# Patient Record
Sex: Female | Born: 1935 | Race: White | Hispanic: Yes | Smoking: Never smoker
Health system: Southern US, Community
[De-identification: ages and names within clinical notes are randomized; demographics above are authoritative.]

## PROBLEM LIST (undated history)

## (undated) DIAGNOSIS — Q211 Atrial septal defect: Secondary | ICD-10-CM

## (undated) DIAGNOSIS — M1710 Unilateral primary osteoarthritis, unspecified knee: Secondary | ICD-10-CM

## (undated) DIAGNOSIS — M545 Low back pain, unspecified: Secondary | ICD-10-CM

## (undated) DIAGNOSIS — E119 Type 2 diabetes mellitus without complications: Secondary | ICD-10-CM

## (undated) DIAGNOSIS — M179 Osteoarthritis of knee, unspecified: Secondary | ICD-10-CM

## (undated) DIAGNOSIS — E7800 Pure hypercholesterolemia, unspecified: Secondary | ICD-10-CM

## (undated) DIAGNOSIS — I253 Aneurysm of heart: Secondary | ICD-10-CM

## (undated) DIAGNOSIS — Q2112 Patent foramen ovale: Secondary | ICD-10-CM

## (undated) HISTORY — PX: HIATAL HERNIA REPAIR: SHX195

---

## 2010-01-25 ENCOUNTER — Emergency Department (HOSPITAL_COMMUNITY): Admission: EM | Admit: 2010-01-25 | Discharge: 2010-01-25 | Payer: Self-pay | Admitting: Emergency Medicine

## 2014-09-22 ENCOUNTER — Inpatient Hospital Stay (HOSPITAL_COMMUNITY): Payer: Medicare Other

## 2014-09-22 ENCOUNTER — Encounter (HOSPITAL_COMMUNITY): Payer: Self-pay | Admitting: Emergency Medicine

## 2014-09-22 ENCOUNTER — Inpatient Hospital Stay (HOSPITAL_COMMUNITY)
Admission: EM | Admit: 2014-09-22 | Discharge: 2014-09-27 | DRG: 062 | Disposition: A | Discharge status: Rehabilitation Facility | Payer: Medicare Other | Attending: Neurology | Admitting: Neurology

## 2014-09-22 ENCOUNTER — Emergency Department (HOSPITAL_COMMUNITY): Payer: Medicare Other

## 2014-09-22 DIAGNOSIS — M1612 Unilateral primary osteoarthritis, left hip: Secondary | ICD-10-CM | POA: Diagnosis present

## 2014-09-22 DIAGNOSIS — K219 Gastro-esophageal reflux disease without esophagitis: Secondary | ICD-10-CM | POA: Diagnosis present

## 2014-09-22 DIAGNOSIS — R52 Pain, unspecified: Secondary | ICD-10-CM

## 2014-09-22 DIAGNOSIS — G8191 Hemiplegia, unspecified affecting right dominant side: Secondary | ICD-10-CM | POA: Diagnosis present

## 2014-09-22 DIAGNOSIS — R4701 Aphasia: Secondary | ICD-10-CM | POA: Diagnosis present

## 2014-09-22 DIAGNOSIS — E785 Hyperlipidemia, unspecified: Secondary | ICD-10-CM | POA: Diagnosis present

## 2014-09-22 DIAGNOSIS — I639 Cerebral infarction, unspecified: Secondary | ICD-10-CM

## 2014-09-22 DIAGNOSIS — E119 Type 2 diabetes mellitus without complications: Secondary | ICD-10-CM

## 2014-09-22 DIAGNOSIS — R4781 Slurred speech: Secondary | ICD-10-CM | POA: Diagnosis present

## 2014-09-22 DIAGNOSIS — Z7982 Long term (current) use of aspirin: Secondary | ICD-10-CM

## 2014-09-22 DIAGNOSIS — E780 Pure hypercholesterolemia: Secondary | ICD-10-CM | POA: Diagnosis present

## 2014-09-22 DIAGNOSIS — Z23 Encounter for immunization: Secondary | ICD-10-CM | POA: Diagnosis not present

## 2014-09-22 DIAGNOSIS — Q211 Atrial septal defect: Secondary | ICD-10-CM

## 2014-09-22 DIAGNOSIS — R471 Dysarthria and anarthria: Secondary | ICD-10-CM | POA: Diagnosis present

## 2014-09-22 DIAGNOSIS — R29810 Facial weakness: Secondary | ICD-10-CM | POA: Diagnosis present

## 2014-09-22 DIAGNOSIS — I517 Cardiomegaly: Secondary | ICD-10-CM

## 2014-09-22 DIAGNOSIS — I6340 Cerebral infarction due to embolism of unspecified cerebral artery: Principal | ICD-10-CM | POA: Diagnosis present

## 2014-09-22 DIAGNOSIS — M25552 Pain in left hip: Secondary | ICD-10-CM

## 2014-09-22 HISTORY — DX: Type 2 diabetes mellitus without complications: E11.9

## 2014-09-22 HISTORY — DX: Pure hypercholesterolemia, unspecified: E78.00

## 2014-09-22 LAB — URINE MICROSCOPIC-ADD ON

## 2014-09-22 LAB — LIPID PANEL
Cholesterol: 156 mg/dL (ref 0–200)
HDL: 48 mg/dL (ref >39)
LDL Cholesterol: 92 mg/dL (ref 0–99)
Total CHOL/HDL Ratio: 3.3 RATIO
Triglycerides: 80 mg/dL (ref <150)
VLDL: 16 mg/dL (ref 0–40)

## 2014-09-22 LAB — COMPREHENSIVE METABOLIC PANEL
ALK PHOS: 56 U/L (ref 39–117)
ALT: 15 U/L (ref 0–35)
AST: 23 U/L (ref 0–37)
Albumin: 3.7 g/dL (ref 3.5–5.2)
Anion gap: 11 (ref 5–15)
BUN: 18 mg/dL (ref 6–23)
CHLORIDE: 101 meq/L (ref 96–112)
CO2: 26 mEq/L (ref 19–32)
Calcium: 9.3 mg/dL (ref 8.4–10.5)
Creatinine, Ser: 0.74 mg/dL (ref 0.50–1.10)
GFR, EST NON AFRICAN AMERICAN: 79 mL/min — AB (ref >90)
GLUCOSE: 148 mg/dL — AB (ref 70–99)
POTASSIUM: 4 meq/L (ref 3.7–5.3)
Sodium: 138 mEq/L (ref 137–147)
TOTAL PROTEIN: 7 g/dL (ref 6.0–8.3)
Total Bilirubin: 0.4 mg/dL (ref 0.3–1.2)

## 2014-09-22 LAB — CBC
HCT: 39.4 % (ref 36.0–46.0)
Hemoglobin: 13 g/dL (ref 12.0–15.0)
MCH: 29 pg (ref 26.0–34.0)
MCHC: 33 g/dL (ref 30.0–36.0)
MCV: 87.9 fL (ref 78.0–100.0)
Platelets: 149 10*3/uL — ABNORMAL LOW (ref 150–400)
RBC: 4.48 MIL/uL (ref 3.87–5.11)
RDW: 13 % (ref 11.5–15.5)
WBC: 5.3 10*3/uL (ref 4.0–10.5)

## 2014-09-22 LAB — URINALYSIS, ROUTINE W REFLEX MICROSCOPIC
BILIRUBIN URINE: NEGATIVE
Glucose, UA: 250 mg/dL — AB
KETONES UR: NEGATIVE mg/dL
Leukocytes, UA: NEGATIVE
NITRITE: NEGATIVE
PH: 5.5 (ref 5.0–8.0)
Protein, ur: NEGATIVE mg/dL
Specific Gravity, Urine: 1.011 (ref 1.005–1.030)
UROBILINOGEN UA: 0.2 mg/dL (ref 0.0–1.0)

## 2014-09-22 LAB — GLUCOSE, CAPILLARY
GLUCOSE-CAPILLARY: 142 mg/dL — AB (ref 70–99)
GLUCOSE-CAPILLARY: 114 mg/dL — AB (ref 70–99)
Glucose-Capillary: 139 mg/dL — ABNORMAL HIGH (ref 70–99)
Glucose-Capillary: 125 mg/dL — ABNORMAL HIGH (ref 70–99)

## 2014-09-22 LAB — DIFFERENTIAL
BASOS ABS: 0 10*3/uL (ref 0.0–0.1)
BASOS PCT: 0 % (ref 0–1)
Eosinophils Absolute: 0.1 10*3/uL (ref 0.0–0.7)
Eosinophils Relative: 3 % (ref 0–5)
LYMPHS ABS: 2 10*3/uL (ref 0.7–4.0)
Lymphocytes Relative: 37 % (ref 12–46)
MONOS PCT: 10 % (ref 3–12)
Monocytes Absolute: 0.5 10*3/uL (ref 0.1–1.0)
NEUTROS ABS: 2.7 10*3/uL (ref 1.7–7.7)
NEUTROS PCT: 50 % (ref 43–77)

## 2014-09-22 LAB — I-STAT CHEM 8, ED
BUN: 18 mg/dL (ref 6–23)
CHLORIDE: 103 meq/L (ref 96–112)
Calcium, Ion: 1.17 mmol/L (ref 1.13–1.30)
Creatinine, Ser: 0.8 mg/dL (ref 0.50–1.10)
GLUCOSE: 150 mg/dL — AB (ref 70–99)
HEMATOCRIT: 42 % (ref 36.0–46.0)
HEMOGLOBIN: 14.3 g/dL (ref 12.0–15.0)
POTASSIUM: 3.8 meq/L (ref 3.7–5.3)
SODIUM: 139 meq/L (ref 137–147)
TCO2: 26 mmol/L (ref 0–100)

## 2014-09-22 LAB — PROTIME-INR
INR: 1 (ref 0.00–1.49)
PROTHROMBIN TIME: 13.2 s (ref 11.6–15.2)

## 2014-09-22 LAB — I-STAT TROPONIN, ED: TROPONIN I, POC: 0 ng/mL (ref 0.00–0.08)

## 2014-09-22 LAB — RAPID URINE DRUG SCREEN, HOSP PERFORMED
AMPHETAMINES: NOT DETECTED
BARBITURATES: NOT DETECTED
BENZODIAZEPINES: NOT DETECTED
COCAINE: NOT DETECTED
OPIATES: NOT DETECTED
TETRAHYDROCANNABINOL: NOT DETECTED

## 2014-09-22 LAB — HEMOGLOBIN A1C
Hgb A1c MFr Bld: 6 % — ABNORMAL HIGH (ref <5.7)
MEAN PLASMA GLUCOSE: 126 mg/dL — AB (ref <117)

## 2014-09-22 LAB — MRSA PCR SCREENING: MRSA by PCR: NEGATIVE

## 2014-09-22 LAB — ETHANOL

## 2014-09-22 LAB — APTT: APTT: 26 s (ref 24–37)

## 2014-09-22 MED ORDER — LINAGLIPTIN 5 MG PO TABS
5.0000 mg | ORAL_TABLET | Freq: Every day | ORAL | Status: DC
  Administered 2014-09-22 – 2014-09-27 (×6): 5 mg via ORAL
  Filled 2014-09-22 (×6): qty 1

## 2014-09-22 MED ORDER — ACETAMINOPHEN 650 MG RE SUPP: 650.0000 mg | RECTAL | Status: DC | PRN

## 2014-09-22 MED ORDER — STROKE: EARLY STAGES OF RECOVERY BOOK
Freq: Once | Status: AC
  Administered 2014-09-22: 06:00:00
  Filled 2014-09-22: qty 1

## 2014-09-22 MED ORDER — SODIUM CHLORIDE 0.9 % IV SOLN
250.0000 mL | Freq: Once | INTRAVENOUS | Status: DC
Start: 2014-09-22 — End: 2014-09-22

## 2014-09-22 MED ORDER — INSULIN ASPART 100 UNIT/ML ~~LOC~~ SOLN
0.0000 [IU] | SUBCUTANEOUS | Status: DC
  Administered 2014-09-22 – 2014-09-23 (×2): 2 [IU] via SUBCUTANEOUS

## 2014-09-22 MED ORDER — SIMVASTATIN 40 MG PO TABS
40.0000 mg | ORAL_TABLET | Freq: Every day | ORAL | Status: DC
  Administered 2014-09-22 – 2014-09-27 (×6): 40 mg via ORAL
  Filled 2014-09-22 (×7): qty 1

## 2014-09-22 MED ORDER — OMEGA-3-ACID ETHYL ESTERS 1 G PO CAPS
1.0000 g | ORAL_CAPSULE | Freq: Every day | ORAL | Status: DC
  Administered 2014-09-22 – 2014-09-27 (×6): 1 g via ORAL
  Filled 2014-09-22 (×6): qty 1

## 2014-09-22 MED ORDER — ALTEPLASE (STROKE) FULL DOSE INFUSION
0.9000 mg/kg | Freq: Once | INTRAVENOUS | Status: AC
  Administered 2014-09-22: 74 mg via INTRAVENOUS
  Filled 2014-09-22: qty 74

## 2014-09-22 MED ORDER — LABETALOL HCL 5 MG/ML IV SOLN
10.0000 mg | INTRAVENOUS | Status: DC | PRN
Start: 2014-09-22 — End: 2014-09-27

## 2014-09-22 MED ORDER — SODIUM CHLORIDE 0.9 % IV SOLN
INTRAVENOUS | Status: DC
  Administered 2014-09-22: 06:00:00 via INTRAVENOUS

## 2014-09-22 MED ORDER — PANTOPRAZOLE SODIUM 40 MG IV SOLR
40.0000 mg | Freq: Every day | INTRAVENOUS | Status: DC
  Administered 2014-09-22 – 2014-09-24 (×3): 40 mg via INTRAVENOUS
  Filled 2014-09-22 (×4): qty 40

## 2014-09-22 MED ORDER — ONDANSETRON HCL 4 MG/2ML IJ SOLN
4.0000 mg | Freq: Four times a day (QID) | INTRAMUSCULAR | Status: DC | PRN
  Administered 2014-09-22 – 2014-09-23 (×2): 4 mg via INTRAVENOUS
  Filled 2014-09-22: qty 2

## 2014-09-22 MED ORDER — ACETAMINOPHEN 325 MG PO TABS
650.0000 mg | ORAL_TABLET | ORAL | Status: DC | PRN
  Administered 2014-09-25: 650 mg via ORAL
  Filled 2014-09-22 (×2): qty 2

## 2014-09-22 NOTE — ED Notes (Signed)
Per ems: pt from home, went to bed around midnight this morning and woke up around 0200 this morning with complaints of right arm pain and tongue heaviness, and blurry vision. Pt spanish speaking, daughter at bedside.

## 2014-09-22 NOTE — H&P (Signed)
Admission H&P    Chief Complaint: New onset slurred speech and right-sided weakness.  HPI: Jean Ward is an 80 y.o. female with a history of diabetes mellitus and hyperlipidemia presenting with acute slurred speech and right facial droop as well as weakness of right arm and right leg. Patient's been taking aspirin daily. There is no history of stroke or TIA. CT scan of her head showed no acute intracranial abnormality. She was last seen well at midnight. Patient woke up at 2 AM with presenting deficits. NIH stroke score was 13. Patient was deemed a candidate for TPA administration which was started.  LSN: 12:00 AM on 09/22/2014 tPA Given: Yes mRankin:  Past Medical History  Diagnosis Date  . Diabetes mellitus without complication   . Hypercholesteremia     Past Surgical History  Procedure Laterality Date  . Hernia repair      No family history on file. Social History:  reports that she has never smoked. She does not have any smokeless tobacco history on file. Her alcohol and drug histories are not on file.  Allergies: Not on File   (Not in a hospital admission)  ROS: History obtained from patient's daughter  General ROS: negative for - chills, fatigue, fever, night sweats, weight gain or weight loss Psychological ROS: negative for - behavioral disorder, hallucinations, memory difficulties, mood swings or suicidal ideation Ophthalmic ROS: negative for - blurry vision, double vision, eye pain or loss of vision ENT ROS: negative for - epistaxis, nasal discharge, oral lesions, sore throat, tinnitus or vertigo Allergy and Immunology ROS: negative for - hives or itchy/watery eyes Hematological and Lymphatic ROS: negative for - bleeding problems, bruising or swollen lymph nodes Endocrine ROS: negative for - galactorrhea, hair pattern changes, polydipsia/polyuria or temperature intolerance Respiratory ROS: negative for - cough, hemoptysis, shortness of breath or  wheezing Cardiovascular ROS: negative for - chest pain, dyspnea on exertion, edema or irregular heartbeat Gastrointestinal ROS: negative for - abdominal pain, diarrhea, hematemesis, nausea/vomiting or stool incontinence Genito-Urinary ROS: negative for - dysuria, hematuria, incontinence or urinary frequency/urgency Musculoskeletal ROS: negative for - joint swelling or muscular weakness Neurological ROS: as noted in HPI Dermatological ROS: negative for rash and skin lesion changes  Physical Examination: Blood pressure 167/76, pulse 72, temperature 97.7 F (36.5 C), temperature source Oral, resp. rate 18, height 5' (1.524 m), weight 82.6 kg (182 lb 1.6 oz), SpO2 100.00%.  HEENT-  Normocephalic, no lesions, without obvious abnormality.  Normal external eye and conjunctiva.  Normal TM's bilaterally.  Normal auditory canals and external ears. Normal external nose, mucus membranes and septum.  Normal pharynx. Neck supple with no masses, nodes, nodules or enlargement. Cardiovascular - regular rate and rhythm, S1, S2 normal, no murmur, click, rub or gallop Lungs - chest clear, no wheezing, rales, normal symmetric air entry, Heart exam - S1, S2 normal, no murmur, no gallop, rate regular Abdomen - soft, non-tender; bowel sounds normal; no masses,  no organomegaly Extremities - no joint deformities, effusion, or inflammation and no edema  Neurologic Examination: Mental Status: Lethargic but verbally responsive, oriented.  Speech is markedly slurred without evidence of aphasia. Able to follow commands with use of left extremities. Cranial Nerves: II-Visual fields were normal. III/IV/VI-Pupils were equal and reacted. Extraocular movements were full and conjugate.    V/VII-no facial numbness; mild right lower facial weakness. VIII-normal. X-moderate to severe dysarthria. Motor: Flaccid paralysis of right upper and lower extremities; normal strength and muscle tone of left extremities. Sensory: Normal  throughout. Deep  Tendon Reflexes: 1+ and symmetric. Plantars: Extensor on the right and flexor on the left Cerebellar: Unable to adequately assess.  Results for orders placed during the hospital encounter of 09/22/14 (from the past 48 hour(s))  I-STAT TROPOININ, ED     Status: None   Collection Time    09/22/14  4:06 AM      Result Value Ref Range   Troponin i, poc 0.00  0.00 - 0.08 ng/mL   Comment 3            Comment: Due to the release kinetics of cTnI,     a negative result within the first hours     of the onset of symptoms does not rule out     myocardial infarction with certainty.     If myocardial infarction is still suspected,     repeat the test at appropriate intervals.  I-STAT CHEM 8, ED     Status: Abnormal   Collection Time    09/22/14  4:08 AM      Result Value Ref Range   Sodium 139  137 - 147 mEq/L   Potassium 3.8  3.7 - 5.3 mEq/L   Chloride 103  96 - 112 mEq/L   BUN 18  6 - 23 mg/dL   Creatinine, Ser 2.13  0.50 - 1.10 mg/dL   Glucose, Bld 086 (*) 70 - 99 mg/dL   Calcium, Ion 5.78  4.69 - 1.30 mmol/L   TCO2 26  0 - 100 mmol/L   Hemoglobin 14.3  12.0 - 15.0 g/dL   HCT 62.9  52.8 - 41.3 %   Ct Head Wo Contrast  09/22/2014   CLINICAL DATA:  Code stroke. Slurred speech and right-sided weakness beginning at 2 a.m.  EXAM: CT HEAD WITHOUT CONTRAST  TECHNIQUE: Contiguous axial images were obtained from the base of the skull through the vertex without intravenous contrast.  COMPARISON:  None.  FINDINGS: Diffuse cerebral atrophy. No significant ventricular dilatation. Mild low-attenuation changes in the deep white matter suggesting small vessel ischemia. No mass effect or midline shift. No abnormal extra-axial fluid collections. Gray-white matter junctions are distinct. Basal cisterns are not effaced. No evidence of acute intracranial hemorrhage. No depressed skull fractures. Visualized paranasal sinuses and mastoid air cells are not opacified.  IMPRESSION: No acute  intracranial abnormalities. Mild atrophy and small vessel ischemic changes.  These results were called by telephone at the time of interpretation on 09/22/2014 at 3:52 am to Dr. Rochele Raring , who verbally acknowledged these results.   Electronically Signed   By: Burman Nieves M.D.   On: 09/22/2014 03:53    Assessment: 80 y.o. female with multiple risk factors for stroke presenting with probable left subcortical MCA territory ischemic infarction.  Stroke Risk Factors - diabetes mellitus and hyperlipidemia  Plan: 1. HgbA1c, fasting lipid panel 2. MRI, MRA  of the brain without contrast 3. PT consult, OT consult, Speech consult 4. Echocardiogram 5. Carotid dopplers 6. Prophylactic therapy-aspirin 325 mg per day for 300 mg suppository daily CT scan of the head 24 hours post TPA administration shows no intracranial hemorrhage 7. Risk factor modification 8. Telemetry monitoring  Patient's admission evaluation and management required complex decision-making, including use of TPA for acute thrombolytic therapy, as well as admission to neuro intensive care unit and family counseling. Total critical care time was 90 minutes.   C.R. Roseanne Reno, MD [facility] [phone] 09/22/2014, 4:26 AM

## 2014-09-22 NOTE — ED Notes (Signed)
Pt reporting she thinks her vision is improving and family reports the patients speech is beginning to improve.

## 2014-09-22 NOTE — Progress Notes (Signed)
STROKE TEAM PROGRESS NOTE   HISTORY Jean Ward is an 80 y.o. female with a history of diabetes mellitus and hyperlipidemia presenting with acute slurred speech and right facial droop as well as weakness of right arm and right leg that started 12:00 AM on 09/22/2014. Patient's been taking aspirin daily. There is no history of stroke or TIA. CT scan of her head showed no acute intracranial abnormality. She was last seen well at midnight. Patient woke up at 2 AM with presenting deficits. NIH stroke score was 13. Patient was deemed a candidate for TPA administration which was started. She was admitted to the neuro ICU for further evaluation and treatment.   SUBJECTIVE (INTERVAL HISTORY) Her grandson and ? Son-in-law are at the bedside.  Overall she feels her condition is rapidly improving. Patient is concerned about MRI - family helped with translation. Patient is spanish speaking. She has shown miraculous improvement after TPA and has recovered completely with practically no residual deficits   OBJECTIVE Temp:  [97.6 F (36.4 C)-97.7 F (36.5 C)] 97.6 F (36.4 C) (10/04 0803) Pulse Rate:  [65-81] 71 (10/04 1000) Cardiac Rhythm:  [-] Normal sinus rhythm (10/04 0800) Resp:  [11-20] 11 (10/04 1000) BP: (85-167)/(31-76) 85/62 mmHg (10/04 1000) SpO2:  [93 %-100 %] 98 % (10/04 1000) Weight:  [82.6 kg (182 lb 1.6 oz)] 82.6 kg (182 lb 1.6 oz) (10/04 0402)   Recent Labs Lab 09/22/14 0759  GLUCAP 139*    Recent Labs Lab 09/22/14 0401 09/22/14 0408  NA 138 139  K 4.0 3.8  CL 101 103  CO2 26  --   GLUCOSE 148* 150*  BUN 18 18  CREATININE 0.74 0.80  CALCIUM 9.3  --     Recent Labs Lab 09/22/14 0401  AST 23  ALT 15  ALKPHOS 56  BILITOT 0.4  PROT 7.0  ALBUMIN 3.7    Recent Labs Lab 09/22/14 0401 09/22/14 0408  WBC 5.3  --   NEUTROABS 2.7  --   HGB 13.0 14.3  HCT 39.4 42.0  MCV 87.9  --   PLT 149*  --    No results found for this basename: CKTOTAL, CKMB, CKMBINDEX,  TROPONINI,  in the last 168 hours  Recent Labs  09/22/14 0401  LABPROT 13.2  INR 1.00    Recent Labs  09/22/14 0418  COLORURINE YELLOW  LABSPEC 1.011  PHURINE 5.5  GLUCOSEU 250*  HGBUR TRACE*  BILIRUBINUR NEGATIVE  KETONESUR NEGATIVE  PROTEINUR NEGATIVE  UROBILINOGEN 0.2  NITRITE NEGATIVE  LEUKOCYTESUR NEGATIVE       Component Value Date/Time   CHOL 156 09/22/2014 0402   TRIG 80 09/22/2014 0402   HDL 48 09/22/2014 0402   CHOLHDL 3.3 09/22/2014 0402   VLDL 16 09/22/2014 0402   LDLCALC 92 09/22/2014 0402   No results found for this basename: HGBA1C      Component Value Date/Time   LABOPIA NONE DETECTED 09/22/2014 0418   COCAINSCRNUR NONE DETECTED 09/22/2014 0418   LABBENZ NONE DETECTED 09/22/2014 0418   AMPHETMU NONE DETECTED 09/22/2014 0418   THCU NONE DETECTED 09/22/2014 0418   LABBARB NONE DETECTED 09/22/2014 0418     Recent Labs Lab 09/22/14 0401  ETH <11    Ct Head Wo Contrast 09/22/2014   No acute intracranial abnormalities. Mild atrophy and small vessel ischemic changes.     PHYSICAL EXAM Pleasant elderly Hispanic lady currently not in distress.Awake alert. Afebrile. Head is nontraumatic. Neck is supple without bruit. Hearing is normal. Cardiac exam  no murmur or gallop. Lungs are clear to auscultation. Distal pulses are well felt. Neurological Exam ; exam limited due to requiring an interpreter Awake  Alert oriented x 3. Normal speech and language.eye movements full without nystagmus.fundi were not visualized. Vision acuity and fields appear normal. Hearing is normal. Palatal movements are normal. Face symmetric. Tongue midline. Normal strength, tone, reflexes and coordination. Normal sensation. Gait deferred. ASSESSMENT/PLAN  [name] is a 80 y.o. female with history of diabetes mellitus and hyperlipidemia presenting with new onset slurred speech and right hemiparesis. She did receive IV t-PA 09/22/2014 at 0413. Initial CT imaging unremarkable.      Stroke:  suspect left brain infarct s/p IV tPA, infarct secondary to unknown source, workup pending. Significant improvement post tPA.  MRI  pending   MRA  pending   Carotid Doppler  pending   2D Echo  pending    aspirin 81 mg orally every day prior to admission, now on no antithrombotics as within 24h of tPA. will likely change to plavix at time of discharge if no embolic source found  SCDs for VTE prophylaxis  NPO .   Bedrest. May be OOB. Ok for evals  Resultant aphasia has resolved, mild right hemiparesis arm remains  Therapy recommendations:  pending   Ongoing aggressive risk factor management  Risk factor education  Keep in ICU 24h post tPA. Anticipate transfer to the floor in am  Disposition:  Pending. Anticipate home with family.  Hyperlipidemia  Home meds:  Lovaza, xocor  LDL 92  Continue statin  Diabetes  HgbA1c pending, Goal < 7.0  Add CBGs, SSI  Resume home meds  Other Stroke Risk Factors Advanced age  Other Active Problems  GERD on home prilosec  Baseline right vision deficit  Hospital day # 0  SHARON BIBY, MSN, RN, ANVP-BC, ANP-BC, [facility] Pager: [phone] 09/22/2014 10:24 AM  This patient is critically ill and at significant risk of neurological worsening, death and care requires constant monitoring of vital signs, hemodynamics,respiratory and cardiac monitoring,review of multiple databases, neurological assessment, discussion with family, other specialists and medical decision making of high complexity.I have made any additions or clarifications directly to the above note.  I spent 30 minutes of neurocritical care time  in the care of  this patient. I have personally examined this patient, reviewed notes, independently viewed imaging studies, participated in medical decision making and plan of care. I have made any additions or clarifications directly to the above note. Agree with note above.   Delia Heady,  MD Medical Director [facility] Pager: [phone] 09/22/2014 2:46 PM    To contact Stroke Continuity provider, please refer to [email or web address]. After hours, contact General Neurology

## 2014-09-22 NOTE — ED Notes (Signed)
NRB removed, 6L [place] placed. O2-98%

## 2014-09-22 NOTE — Progress Notes (Signed)
TPA Delivery   Jean Ward is an 80 y.o. female who [facility] with a chief complaint of  Chief Complaint  Patient presents with  . Code Stroke   TPA delivered to bedside at 0408  Abran DukeLedford, Joniece Smotherman 09/22/2014, 4:15 AM

## 2014-09-22 NOTE — ED Notes (Signed)
Patient arrived to ER at 0336, determined to call code stroke, and transferred to CT.

## 2014-09-22 NOTE — Progress Notes (Signed)
Echo Lab  2D Echocardiogram completed.  Avyan Livesay L Elleen Coulibaly, RDCS 09/22/2014 12:21 PM

## 2014-09-22 NOTE — Progress Notes (Signed)
Code Stoke called on 80 y.o female. Per pt daughter at bedside, pt went to sleep around 12 midnight and woke up around 2 am and called her. On the phone call her mother complained of difficulty speaking, blurred vision and inability to move her right arm and leg. Daughter called EMS and pt was [facility]. Pt has hx of DM and high cholesterol, currently takes aspirin daily. CT scan done stat, negative for a bleed per Dr. Roseanne RenoStewart. CBG 104. NIHSS done yielding 13. Pt flaccid on her right side, right side facial droop,slurred speech, drowsy but responsive. On initial assessment due to closing window NIHSS completed quickly, language barrier present with telephone interpreter service used then Daughter interpreting once at bedside. Unable to assess limb ataxia at that time per Dr. Roseanne RenoStewart. 2 pivs and FC placed. Pressure dressing applied to bruise on left hand resulting from EMS PIV attempt in field. TPA gtt started at 0413.  0440 -Pt able to squeeze right hand gently, and some movement to command in right foot. Dysarthria and vision  improving per family at bedside. Bed obtained on 3 M, report called per ED RN.

## 2014-09-22 NOTE — Progress Notes (Signed)
VASCULAR LAB PRELIMINARY  PRELIMINARY  PRELIMINARY  PRELIMINARY  Carotid duplex completed.    Preliminary report:  Bilateral:  1-39% ICA stenosis.  Vertebral artery flow is antegrade.      Firmin Belisle, RVT 09/22/2014, 11:20 AM

## 2014-09-22 NOTE — ED Provider Notes (Signed)
TIME SEEN: 3:40 AM  CHIEF COMPLAINT: Dysarthria, right-sided facial droop and right-sided weakness  HPI: Patient is a 80 y.o. F Hispanic female with history of diabetes, hyperlipidemia who presents to the emergency department with right-sided weakness, dysarthria when she woke up at 2 AM. Went to bed around midnight. No history of head injury. No headache. No prior history of stroke. On anticoagulation. No chest pain or shortness of breath. Glucose was EMS was 127. Blood pressure normal.  ROS: See HPI Constitutional: no fever  Eyes: no drainage  ENT: no runny nose   Cardiovascular:  no chest pain  Resp: no SOB  GI: no vomiting GU: no dysuria Integumentary: no rash  Allergy: no hives  Musculoskeletal: no leg swelling  Neurological: no slurred speech ROS otherwise negative  PAST MEDICAL HISTORY/PAST SURGICAL HISTORY:  No past medical history on file.  MEDICATIONS:  Prior to Admission medications   Not on File    ALLERGIES:  Allergies not on file  SOCIAL HISTORY:  History  Substance Use Topics  . Smoking status: Not on file  . Smokeless tobacco: Not on file  . Alcohol Use: Not on file    FAMILY HISTORY: No family history on file.  EXAM: BP 131/42  Pulse 68  Temp(Src) 97.6 F (36.4 C) (Oral)  Resp 13  Ht 5' (1.524 m)  Wt 182 lb 1.6 oz (82.6 kg)  BMI 35.56 kg/m2  SpO2 96% CONSTITUTIONAL: Alert and oriented and responds appropriately to questions. Well-appearing; well-nourished HEAD: Normocephalic EYES: Conjunctivae clear, PERRL ENT: normal nose; no rhinorrhea; moist mucous membranes; pharynx without lesions noted NECK: Supple, no meningismus, no LAD  CARD: RRR; S1 and S2 appreciated; no murmurs, no clicks, no rubs, no gallops RESP: Normal chest excursion without splinting or tachypnea; breath sounds clear and equal bilaterally; no wheezes, no rhonchi, no rales,  ABD/GI: Normal bowel sounds; non-distended; soft, non-tender, no rebound, no guarding BACK:  The back  appears normal and is non-tender to palpation, there is no CVA tenderness EXT: Normal ROM in all joints; non-tender to palpation; no edema; normal capillary refill; no cyanosis    SKIN: Normal color for age and race; warm NEURO: Right-sided facial droop, right-sided hemiparesis, dysarthria, moving left upper and lower extremity normally PSYCH: The patient's mood and manner are appropriate. Grooming and personal hygiene are appropriate.  MEDICAL DECISION MAKING: Patient here with a code stroke. Vital signs, glucose normal. Protecting airway. Patient head CT.  ED PROGRESS: Head CT shows no acute abnormality. Neurology at bedside.   Dr. Roseanne RenoStewart at bedside. Recommends giving TPA for stroke.  Will admit.   Labs unremarkable.        CRITICAL CARE Performed by: Raelyn NumberWARD, Amonda Brillhart N   Total critical care time: 40 minutes  Critical care time was exclusive of separately billable procedures and treating other patients.  Critical care was necessary to treat or prevent imminent or life-threatening deterioration.  Critical care was time spent personally by me on the following activities: development of treatment plan with patient and/or surrogate as well as nursing, discussions with consultants, evaluation of patient's response to treatment, examination of patient, obtaining history from patient or surrogate, ordering and performing treatments and interventions, ordering and review of laboratory studies, ordering and review of radiographic studies, pulse oximetry and re-evaluation of patient's condition.   Layla MawKristen N Damoni Causby, DO 09/22/14 [phone]

## 2014-09-22 NOTE — ED Notes (Signed)
Pharmacy contacted about tpa ordered stat by rapid nurse brooke

## 2014-09-22 NOTE — Evaluation (Signed)
Clinical/Bedside Swallow Evaluation Patient Details  Name: Jean Ward MRN: [id number] Date of Birth: 03-Nov-1933  Today's Date: 09/22/2014 Time: [id number] SLP Time Calculation (min): 16 min  Past Medical History:  Past Medical History  Diagnosis Date  . Diabetes mellitus without complication   . Hypercholesteremia    Past Surgical History:  Past Surgical History  Procedure Laterality Date  . Hernia repair     HPI:  80 y.o. female with a history of GERD, diabetes mellitus and hyperlipidemia presenting with acute slurred speech and right facial droop as well as weakness of right arm and right leg. Initial head CT showed no acute bleed. MD suspects left brain infarct. TPA administered. NIH score 13.    Assessment / Plan / Recommendation Clinical Impression  Patient presents with a functional oropharyngeal swallow without overt s/s of aspiration. No SLP f/u indicated at this time. Educated patient and family regarding s/s of aspiration and risks given acute CVA.     Aspiration Risk  Mild    Diet Recommendation Regular;Thin liquid   Liquid Administration via: Cup;Straw Medication Administration: Whole meds with liquid Supervision: Patient able to self feed Compensations: Slow rate;Small sips/bites Postural Changes and/or Swallow Maneuvers: Seated upright 90 degrees    Other  Recommendations Oral Care Recommendations: Oral care BID   Follow Up Recommendations  None (for dysphagia)    Frequency and Duration        Pertinent Vitals/Pain n/a        Swallow Study    General HPI: 80 y.o. female with a history of GERD, diabetes mellitus and hyperlipidemia presenting with acute slurred speech and right facial droop as well as weakness of right arm and right leg. Initial head CT showed no acute bleed. MD suspects left brain infarct. TPA administered. NIH score 13.  Type of Study: Bedside swallow evaluation Previous Swallow Assessment: none noted Diet Prior to this Study:  NPO Temperature Spikes Noted: No Respiratory Status: Room air History of Recent Intubation: No Behavior/Cognition: Alert;Cooperative;Pleasant mood Oral Cavity - Dentition: Dentures, top;Dentures, bottom Self-Feeding Abilities: Able to feed self Patient Positioning: Upright in bed Baseline Vocal Quality: Clear Volitional Cough: Strong Volitional Swallow: Able to elicit    Oral/Motor/Sensory Function Overall Oral Motor/Sensory Function: Appears within functional limits for tasks assessed   Ice Chips Ice chips: Not tested   Thin Liquid Thin Liquid: Within functional limits Presentation: Cup;Self Fed;Straw    Nectar Thick Nectar Thick Liquid: Not tested   Honey Thick Honey Thick Liquid: Not tested   Puree Puree: Within functional limits Presentation: Self Fed;Spoon   Solid   GO   Delesa Kawa MA, CCC-SLP [phone]  Solid: Within functional limits Presentation: Self Fed       Tunya Held Meryl 09/22/2014,11:37 AM

## 2014-09-22 NOTE — Progress Notes (Signed)
Utilization Review Completed.   Vyla Pint, RN, BSN Nurse Case Manager  

## 2014-09-22 NOTE — ED Notes (Signed)
EMS attempted IV to left hand unsuccessfully and mild swelling and bruising to left hand. Pressure dressing applied and secured to left hand prior to starting of TPA.

## 2014-09-23 ENCOUNTER — Encounter (HOSPITAL_COMMUNITY): Payer: Self-pay | Admitting: Radiology

## 2014-09-23 ENCOUNTER — Inpatient Hospital Stay (HOSPITAL_COMMUNITY): Payer: Medicare Other

## 2014-09-23 LAB — GLUCOSE, CAPILLARY
GLUCOSE-CAPILLARY: 136 mg/dL — AB (ref 70–99)
GLUCOSE-CAPILLARY: 113 mg/dL — AB (ref 70–99)
GLUCOSE-CAPILLARY: 112 mg/dL — AB (ref 70–99)
Glucose-Capillary: 114 mg/dL — ABNORMAL HIGH (ref 70–99)
Glucose-Capillary: 122 mg/dL — ABNORMAL HIGH (ref 70–99)
Glucose-Capillary: 129 mg/dL — ABNORMAL HIGH (ref 70–99)
Glucose-Capillary: 94 mg/dL (ref 70–99)

## 2014-09-23 MED ORDER — ASPIRIN EC 325 MG PO TBEC: 325.0000 mg | DELAYED_RELEASE_TABLET | Freq: Every day | ORAL | Status: DC

## 2014-09-23 MED ORDER — CLOPIDOGREL BISULFATE 75 MG PO TABS
75.0000 mg | ORAL_TABLET | Freq: Every day | ORAL | Status: DC
  Administered 2014-09-23 – 2014-09-27 (×5): 75 mg via ORAL
  Filled 2014-09-23 (×5): qty 1

## 2014-09-23 NOTE — Progress Notes (Signed)
Occupational Therapy Evaluation Patient Details Name: Jean Ward MRN: [id number] DOB: 1933/07/06 Today's Date: 09/23/2014    History of Present Illness 80 y.o. female with a history of GERD, diabetes mellitus and hyperlipidemia presenting with acute slurred speech and right facial droop as well as weakness of right arm and right leg. Initial head CT showed no acute bleed. MD suspects left brain infarct. TPA administered. NIH score 13. Neuro workup underway.   Clinical Impression   PTA, pt independent with ADL and mobility. Pt presents with R sided weakness and appears to demonstrate proprioceptive deficits with R U/LE. Feelpt is excellent candidate for CIR and could achieve mod I level to return home with intermittent S of daughter. At this time, pt is min A with mobility and ADL. Pt needs 24/7 S at this time. Family very supportive and interested in CIR. Will follow acutely to maximize functional levl of independence with ADL and mobility to facilitate safe D/C home.     Follow Up Recommendations  CIR;Supervision/Assistance - 24 hour    Equipment Recommendations  3 in 1 bedside comode;Tub/shower bench    Recommendations for Other Services Rehab consult     Precautions / Restrictions Precautions Precautions: Fall Precaution Comments: r inattention      Mobility Bed Mobility Overal bed mobility: Needs Assistance Bed Mobility: Supine to Sit     Supine to sit: Min assist;HOB elevated        Transfers Overall transfer level: Needs assistance Equipment used: 1 person hand held assist Transfers: Sit to/from UGI CorporationStand;Stand Pivot Transfers Sit to Stand: Min assist Stand pivot transfers: Min assist       General transfer comment: Pt states she feels like she is not steady when standing. Feels as if the floor is moving.    Balance Overall balance assessment: Needs assistance Sitting-balance support: Feet supported Sitting balance-Leahy Scale: Fair Sitting balance -  Comments: Pt leaning posteriorly when distracted   Standing balance support: Single extremity supported;During functional activity Standing balance-Leahy Scale: Poor Standing balance comment: r bias at times                            ADL Overall ADL's : Needs assistance/impaired     Grooming: Standing;Min guard   Upper Body Bathing: Set up;Sitting   Lower Body Bathing: Minimal assistance;Sit to/from stand   Upper Body Dressing : Minimal assistance;Sitting   Lower Body Dressing: Minimal assistance;Sit to/from stand   Toilet Transfer: Minimal assistance;Ambulation;Regular Toilet   Toileting- Clothing Manipulation and Hygiene: Minimal assistance;Sit to/from stand       Functional mobility during ADLs: Minimal assistance;Cueing for safety General ADL Comments: appears to demonstate decreased awwareness R side during ADL. Pt ambulating to bathroom and running into wall on R side/leaving R arm outside door frame     Vision                 Additional Comments: Pt reports new blurring of vision. will further assess   Perception     Praxis Praxis Praxis tested?: Within functional limits    Pertinent Vitals/Pain Pain Assessment: No/denies pain     Hand Dominance Right   Extremity/Trunk Assessment Upper Extremity Assessment Upper Extremity Assessment: RUE deficits/detail RUE Deficits / Details: generalized weakness RUE Sensation: decreased proprioception RUE Coordination: decreased fine motor;decreased gross motor Pt reports dropping items when trying to use R hand.    Lower Extremity Assessment Lower Extremity Assessment: Defer to PT evaluation   Cervical /  Trunk Assessment Cervical / Trunk Assessment: Normal   Communication Communication Communication: Prefers language other than English;Other (comment) (spainish)   Cognition Arousal/Alertness: Awake/alert Behavior During Therapy: WFL for tasks assessed/performed Overall Cognitive Status:  Difficult to assess (Pt states her hand/arm and leg do not feel "normal")                     General Comments       Exercises       Shoulder Instructions      Home Living Family/patient expects to be discharged to:: Private residence Living Arrangements: Children Available Help at Discharge: Family;Available PRN/intermittently Type of Home: Apartment Home Access: Stairs to enter Entrance Stairs-Number of Steps: 2   Home Layout: Two level;Other (Comment) (daughter states it canbe arranged for her to live on main le)     Bathroom Shower/Tub: Chief Strategy Officer: Standard Bathroom Accessibility: Yes How Accessible: Accessible via walker Home Equipment: None          Prior Functioning/Environment Level of Independence: Independent             OT Diagnosis: Generalized weakness;Other (comment) (will assess vision)   OT Problem List: Decreased strength;Decreased activity tolerance;Impaired balance (sitting and/or standing);Decreased coordination;Decreased safety awareness;Impaired sensation;Impaired tone;Obesity;Impaired UE functional use   OT Treatment/Interventions: Self-care/ADL training;Therapeutic exercise;Neuromuscular education;DME and/or AE instruction;Therapeutic activities;Cognitive remediation/compensation;Patient/family education;Balance training    OT Goals(Current goals can be found in the care plan section) Acute Rehab OT Goals Patient Stated Goal: to be independent OT Goal Formulation: With patient/family Time For Goal Achievement: 10/07/14 Potential to Achieve Goals: Good  OT Frequency: Min 3X/week   Barriers to D/C: Other (comment) (daughter works during the day)          Co-evaluation              End of Journalist, newspaper During Treatment: Stage manager Communication: Mobility status  Activity Tolerance: Patient tolerated treatment well Patient left: Other (comment) (in w/c ready for transport)    Time: 1610-9604 OT Time Calculation (min): 33 min Charges:  OT General Charges $OT Visit: 1 Procedure OT Evaluation $Initial OT Evaluation Tier I: 1 Procedure OT Treatments $Self Care/Home Management : 23-37 mins G-Codes:    Vicy Medico,HILLARY 2014/10/08, 5:09 PM   [facility], OTR/L  [phone] 10-08-14

## 2014-09-23 NOTE — Consult Note (Signed)
Physical Medicine and Rehabilitation Consult Reason for Consult:strok Referring Physician:  sethi   HPI: Jean Ward is a 80 y.o. Hispanic female with history of DM type 2, hyperlipidemia who was admitted on 09/22/14 with slurred speech, right sided weakness and right facial droop. CT head negative for abnormality and she was treated with tPA with improvements. MRI/MRA brain done revealing widespread areas of restricted diffusion are seen throughout the brain, most notable in the LEFT paramedian pons, consistent with multiple areas of acute infarction question emboli. 2D echo with EF 55-60% and no wall abnormality. Carotid dopplers without significant ICA stenosis. ST evaluation done revealing mild dysarthria and intact cognitive linguistic function.     ROS Past Medical History  Diagnosis Date  . Diabetes mellitus without complication   . Hypercholesteremia    Past Surgical History  Procedure Laterality Date  . Hernia repair     No family history on file. Social History:  reports that she has never smoked. She does not have any smokeless tobacco history on file. Her alcohol and drug histories are not on file. Allergies: No Known Allergies Medications Prior to Admission  Medication Sig Dispense Refill  . aspirin EC 81 MG tablet Take 81 mg by mouth daily.      Marland Kitchen omega-3 acid ethyl esters (LOVAZA) 1 G capsule Take 1 g by mouth daily.      Marland Kitchen omeprazole (PRILOSEC) 20 MG capsule Take 40 mg by mouth daily.      . simvastatin (ZOCOR) 40 MG tablet Take 40 mg by mouth daily.      . sitaGLIPtin (JANUVIA) 100 MG tablet Take 100 mg by mouth daily.        Home: Home Living Family/patient expects to be discharged to:: Private residence Living Arrangements: Children  Functional History:   Functional Status:  Mobility:          ADL:    Cognition: Cognition Overall Cognitive Status: Within Functional Limits for tasks assessed Orientation Level: Oriented  X4 Cognition Overall Cognitive Status: Within Functional Limits for tasks assessed  Blood pressure 123/49, pulse 70, temperature 98 F (36.7 C), temperature source Oral, resp. rate 15, height 5' (1.524 m), weight 82.6 kg (182 lb 1.6 oz), SpO2 94.00%. Physical Exam  Results for orders placed during the hospital encounter of 09/22/14 (from the past 24 hour(s))  GLUCOSE, CAPILLARY     Status: Abnormal   Collection Time    09/22/14 12:06 PM      Result Value Ref Range   Glucose-Capillary 142 (*) 70 - 99 mg/dL  GLUCOSE, CAPILLARY     Status: Abnormal   Collection Time    09/22/14  4:03 PM      Result Value Ref Range   Glucose-Capillary 114 (*) 70 - 99 mg/dL  GLUCOSE, CAPILLARY     Status: Abnormal   Collection Time    09/22/14  8:06 PM      Result Value Ref Range   Glucose-Capillary 125 (*) 70 - 99 mg/dL  GLUCOSE, CAPILLARY     Status: None   Collection Time    09/22/14 11:55 PM      Result Value Ref Range   Glucose-Capillary 94  70 - 99 mg/dL  GLUCOSE, CAPILLARY     Status: Abnormal   Collection Time    09/23/14  4:25 AM      Result Value Ref Range   Glucose-Capillary 113 (*) 70 - 99 mg/dL  GLUCOSE, CAPILLARY     Status:  Abnormal   Collection Time    09/23/14  7:56 AM      Result Value Ref Range   Glucose-Capillary 114 (*) 70 - 99 mg/dL   Comment 1 Notify RN     Comment 2 Documented in Chart     Ct Head Wo Contrast  09/23/2014   CLINICAL DATA:  Followup stroke.  24 hr status post tPA.  EXAM: CT HEAD WITHOUT CONTRAST  TECHNIQUE: Contiguous axial images were obtained from the base of the skull through the vertex without intravenous contrast.  COMPARISON:  Prior MRI from 09/22/2014  FINDINGS: Multiple previously identified small ischemic infarcts involving the left paramedian pons, right occipital lobe, temporal lobe, left frontal and left temporal lobes are not well visualized. No evidence of hemorrhagic transformation status post tPA. No definite new large vessel territory  infarct.  No midline shift. No hydrocephalus. No extra-axial fluid collection.  Calvarium intact.  Scalp soft tissues unremarkable.  No acute abnormality seen about either orbit.  Paranasal sinuses and mastoid air cells are clear.  IMPRESSION: 1. Poor visualization of previously identified small multi focal acute ischemic infarcts involving the brain as seen on prior MRI from 09/22/2014. No evidence of hemorrhagic transformation or other bleed status post tPA. 2. No new acute intracranial process.   Electronically Signed   By: Rise MuBenjamin  McClintock M.D.   On: 09/23/2014 05:45   Ct Head Wo Contrast  09/22/2014   CLINICAL DATA:  Code stroke. Slurred speech and right-sided weakness beginning at 2 a.m.  EXAM: CT HEAD WITHOUT CONTRAST  TECHNIQUE: Contiguous axial images were obtained from the base of the skull through the vertex without intravenous contrast.  COMPARISON:  None.  FINDINGS: Diffuse cerebral atrophy. No significant ventricular dilatation. Mild low-attenuation changes in the deep white matter suggesting small vessel ischemia. No mass effect or midline shift. No abnormal extra-axial fluid collections. Gray-white matter junctions are distinct. Basal cisterns are not effaced. No evidence of acute intracranial hemorrhage. No depressed skull fractures. Visualized paranasal sinuses and mastoid air cells are not opacified.  IMPRESSION: No acute intracranial abnormalities. Mild atrophy and small vessel ischemic changes.  These results were called by telephone at the time of interpretation on 09/22/2014 at 3:52 am to Dr. Rochele RaringKRISTEN WARD , who verbally acknowledged these results.   Electronically Signed   By: Burman NievesWilliam  Stevens M.D.   On: 09/22/2014 03:53   Mr Brain Wo Contrast  09/22/2014   CLINICAL DATA:  RIGHT-sided weakness and dysarthria which began at midnight 09/22/2014. Stroke risk factors include diabetes and hypertension. TPA was administered  EXAM: MRI HEAD WITHOUT CONTRAST  MRA HEAD WITHOUT CONTRAST   TECHNIQUE: Multiplanar, multiecho pulse sequences of the brain and surrounding structures were obtained without intravenous contrast. Angiographic images of the head were obtained using MRA technique without contrast.  COMPARISON:  CT head earlier in the day  FINDINGS: MRI HEAD FINDINGS  Widespread areas of restricted diffusion are seen throughout the brain consistent with multiple areas of acute infarction. These include the LEFT paramedian pons, RIGHT occipital lobe, RIGHT posterior temporal lobe, LEFT posterior frontal cortex, and LEFT posterior temporal lobe. There are no areas of hemorrhage, mass lesion, hydrocephalus, or extra-axial fluid. Mild cerebral and cerebellar atrophy is present. Mild subcortical and periventricular T2 and FLAIR hyperintensities, likely chronic microvascular ischemic change. Normal pituitary and cerebellar tonsils. Mild pannus. Mild cervical spondylosis. Flow voids are maintained. BILATERAL cataract extraction. No sinus or mastoid disease. Compared with prior CT, the infarcts are not visible.  MRA HEAD  FINDINGS  The internal carotid arteries are widely patent. The basilar artery is widely patent with the LEFT vertebral the dominant/sole contributor. RIGHT vertebral contributes to PICA. There is no visible intracranial stenosis or berry aneurysm. No distal MCA and PCA irregularity. 2 mm medial wide necked outpouching from the superior cavernous LEFT ICA consistent with atheromatous disease.  IMPRESSION: Widespread areas of restricted diffusion are seen throughout the brain, most notable in the LEFT paramedian pons, consistent with multiple areas of acute infarction. No areas of hemorrhagic transformation. These lie within different vascular territories. A shower of emboli is not excluded. See discussion above.  No proximal flow reducing lesion is evident on intracranial MRA.   Electronically Signed   By: Davonna Belling M.D.   On: 09/22/2014 16:46   Mr Maxine Glenn Head/brain Wo Cm  09/22/2014    CLINICAL DATA:  RIGHT-sided weakness and dysarthria which began at midnight 09/22/2014. Stroke risk factors include diabetes and hypertension. TPA was administered  EXAM: MRI HEAD WITHOUT CONTRAST  MRA HEAD WITHOUT CONTRAST  TECHNIQUE: Multiplanar, multiecho pulse sequences of the brain and surrounding structures were obtained without intravenous contrast. Angiographic images of the head were obtained using MRA technique without contrast.  COMPARISON:  CT head earlier in the day  FINDINGS: MRI HEAD FINDINGS  Widespread areas of restricted diffusion are seen throughout the brain consistent with multiple areas of acute infarction. These include the LEFT paramedian pons, RIGHT occipital lobe, RIGHT posterior temporal lobe, LEFT posterior frontal cortex, and LEFT posterior temporal lobe. There are no areas of hemorrhage, mass lesion, hydrocephalus, or extra-axial fluid. Mild cerebral and cerebellar atrophy is present. Mild subcortical and periventricular T2 and FLAIR hyperintensities, likely chronic microvascular ischemic change. Normal pituitary and cerebellar tonsils. Mild pannus. Mild cervical spondylosis. Flow voids are maintained. BILATERAL cataract extraction. No sinus or mastoid disease. Compared with prior CT, the infarcts are not visible.  MRA HEAD FINDINGS  The internal carotid arteries are widely patent. The basilar artery is widely patent with the LEFT vertebral the dominant/sole contributor. RIGHT vertebral contributes to PICA. There is no visible intracranial stenosis or berry aneurysm. No distal MCA and PCA irregularity. 2 mm medial wide necked outpouching from the superior cavernous LEFT ICA consistent with atheromatous disease.  IMPRESSION: Widespread areas of restricted diffusion are seen throughout the brain, most notable in the LEFT paramedian pons, consistent with multiple areas of acute infarction. No areas of hemorrhagic transformation. These lie within different vascular territories. A shower of  emboli is not excluded. See discussion above.  No proximal flow reducing lesion is evident on intracranial MRA.   Electronically Signed   By: Davonna Belling M.D.   On: 09/22/2014 16:46    Assessment/Plan: Diagnosis: embolic CVA   RECOMMENDATIONS: This patient's condition is appropriate for continued rehabilitative care in the following setting: [facility] Therapy and Outpatient Therapy Patient has agreed to participate in recommended program. Potentially Note that insurance prior authorization may be required for reimbursement for recommended care.  Comment: Pt is min guard assist hospital day #2. Should continue to improve. Does not require CIR -level rehab.  Ranelle Oyster, MD, [facility] [address] [facility] Physical Medicine & Rehabilitation 09/23/2014     09/23/2014

## 2014-09-23 NOTE — Evaluation (Signed)
Physical Therapy Evaluation Patient Details Name: Jean Ward MRN: [id number] DOB: 06/16/1933 Today's Date: 09/23/2014   History of Present Illness  80 y.o. female with a history of GERD, diabetes mellitus and hyperlipidemia presenting with acute slurred speech and right facial droop as well as weakness of right arm and right leg. Initial head CT showed no acute bleed. MD suspects left brain infarct. TPA administered. NIH score 13. Neuro workup underway.  Clinical Impression  Patient demonstrates deficits in functional mobility as indicated below. Will need continued skilled PT to address deficits and maximize function. Will see as indicated and progress as tolerated. Patient was independent PTA, daughter works during the day; family unsure if able to provide 24/7 assist at this time, therefore patient needs to be independent upon return home, recommending CIR consult at this time, will monitor progress for continued discharge planning.     Follow Up Recommendations CIR    Equipment Recommendations  Other (comment) (TBD)    Recommendations for Other Services Rehab consult     Precautions / Restrictions Precautions Precautions: Fall Restrictions Weight Bearing Restrictions: No      Mobility  Bed Mobility Overal bed mobility: Needs Assistance Bed Mobility: Supine to Sit     Supine to sit: Min assist     General bed mobility comments: increased time to perform, min assist secondary to positioning within bed  Transfers Overall transfer level: Needs assistance Equipment used: Rolling walker (2 wheeled) Transfers: Sit to/from Stand Sit to Stand: Min guard         General transfer comment: VCs for hand placement and safety with use of RW.  Ambulation/Gait Ambulation/Gait assistance: Min guard;Min assist Ambulation Distance (Feet): 190 Feet (80 ft with HHA (min assist for stability)) Assistive device: Rolling walker (2 wheeled) Gait Pattern/deviations: Step-through  pattern;Decreased stride length;Drifts right/left;Trunk flexed Gait velocity: decreased Gait velocity interpretation: <1.8 ft/sec, indicative of risk for recurrent falls General Gait Details: instability noted with ambulation, increased lateral sway to the right  Stairs            Wheelchair Mobility    Modified Rankin (Stroke Patients Only) Modified Rankin (Stroke Patients Only) Pre-Morbid Rankin Score: No symptoms Modified Rankin: Moderately severe disability     Balance Overall balance assessment: Needs assistance   Sitting balance-Leahy Scale: Good     Standing balance support: Bilateral upper extremity supported;During functional activity Standing balance-Leahy Scale: Fair                               Pertinent Vitals/Pain Pain Assessment: No/denies pain    Home Living Family/patient expects to be discharged to:: Private residence Living Arrangements: Children Available Help at Discharge: Family;Available PRN/intermittently Type of Home: Apartment Home Access: Stairs to enter   Entrance Stairs-Number of Steps: 2 Home Layout: Two level Home Equipment: None      Prior Function Level of Independence: Independent               Hand Dominance   Dominant Hand: Right    Extremity/Trunk Assessment   Upper Extremity Assessment: Defer to OT evaluation           Lower Extremity Assessment: RLE deficits/detail;Generalized weakness RLE Deficits / Details: Patient reports functional RLE weakness with noted RLE buckling at times during ambulation       Communication   Communication: Prefers language other than English  Cognition Arousal/Alertness: Awake/alert Behavior During Therapy: WFL for tasks assessed/performed Overall Cognitive Status:  Within Functional Limits for tasks assessed                      General Comments      Exercises        Assessment/Plan    PT Assessment Patient needs continued PT services   PT Diagnosis Difficulty walking;Abnormality of gait;Generalized weakness   PT Problem List Decreased strength;Decreased range of motion;Decreased activity tolerance;Decreased balance;Decreased mobility;Decreased knowledge of use of DME  PT Treatment Interventions DME instruction;Gait training;Stair training;Functional mobility training;Therapeutic activities;Therapeutic exercise;Balance training;Patient/family education   PT Goals (Current goals can be found in the Care Plan section) Acute Rehab PT Goals Patient Stated Goal: to walk normally PT Goal Formulation: With patient/family Time For Goal Achievement: 10/07/14 Potential to Achieve Goals: Good    Frequency Min 4X/week   Barriers to discharge Inaccessible home environment;Decreased caregiver support      Co-evaluation               End of Session Equipment Utilized During Treatment: Gait belt Activity Tolerance: Patient tolerated treatment well Patient left: in chair;with call bell/phone within reach;with family/visitor present Nurse Communication: Mobility status         Time: 0901-0926 PT Time Calculation (min): 25 min   Charges:   PT Evaluation $Initial PT Evaluation Tier I: 1 Procedure PT Treatments $Gait Training: 8-22 mins $Therapeutic Activity: 8-22 mins   PT G CodesFabio Asa 09/23/2014, 11:11 AM Charlotte Crumb, PT DPT  [phone]

## 2014-09-23 NOTE — Evaluation (Signed)
Speech Language Pathology Evaluation Patient Details Name: Jean Ward MRN: [id number] DOB: 1933/11/28 Today's Date: 09/23/2014 Time:  -     Problem List:  Patient Active Problem List   Diagnosis Date Noted  . CVA (cerebral infarction) 09/22/2014  . Diabetes mellitus type 2, controlled, without complications 09/22/2014   Past Medical History:  Past Medical History  Diagnosis Date  . Diabetes mellitus without complication   . Hypercholesteremia    Past Surgical History:  Past Surgical History  Procedure Laterality Date  . Hernia repair     HPI:  80 y.o. female with a history of GERD, diabetes mellitus and hyperlipidemia presenting with acute slurred speech and right facial droop as well as weakness of right arm and right leg. Initial head CT showed no acute bleed. MD suspects left brain infarct. TPA administered. NIH score 13.    Assessment / Plan / Recommendation Clinical Impression  Pt presents with a mild dysarthria only noticable to pt, fully intelligible. Cogntive lingusitic function otherwise in tact. No SLP f/u  needed.     SLP Assessment  Patient does not need any further Speech Lanaguage Pathology Services    Follow Up Recommendations       Frequency and Duration        Pertinent Vitals/Pain     SLP Goals     SLP Evaluation Prior Functioning  Cognitive/Linguistic Baseline: Within functional limits   Cognition  Overall Cognitive Status: Within Functional Limits for tasks assessed Orientation Level: Oriented X4    Comprehension  Auditory Comprehension Overall Auditory Comprehension: Appears within functional limits for tasks assessed    Expression Verbal Expression Overall Verbal Expression: Appears within functional limits for tasks assessed   Oral / Motor Oral Motor/Sensory Function Overall Oral Motor/Sensory Function: Appears within functional limits for tasks assessed Motor Speech Overall Motor Speech: Appears within functional limits for  tasks assessed   GO    Jean DittyBonnie Jacklin Zwick, MA CCC-SLP [phone]  Jean Ward, Jean Ward 09/23/2014, 9:48 AM

## 2014-09-23 NOTE — Progress Notes (Signed)
STROKE TEAM PROGRESS NOTE   HISTORY Jean Ward is an 80 y.o. female with a history of diabetes mellitus and hyperlipidemia presenting with acute slurred speech and right facial droop as well as weakness of right arm and right leg that started 12:00 AM on 09/22/2014. Patient's been taking aspirin daily. There is no history of stroke or TIA. CT scan of her head showed no acute intracranial abnormality. She was last seen well at midnight. Patient woke up at 2 AM with presenting deficits. NIH stroke score was 13. Patient was deemed a candidate for TPA administration which was started. She was admitted to the neuro ICU for further evaluation and treatment.   SUBJECTIVE (INTERVAL HISTORY) She feels is improved. I had an extensive discussion with the patient with help of interpreter and answered questions I also spoke to the patient's daughter   OBJECTIVE Temp:  [97.8 F (36.6 C)-98 F (36.7 C)] 98 F (36.7 C) (10/05 0425) Pulse Rate:  [61-95] 64 (10/05 0755) Cardiac Rhythm:  [-] Normal sinus rhythm (10/05 0800) Resp:  [11-28] 15 (10/05 0755) BP: (85-151)/(41-88) 143/42 mmHg (10/05 0755) SpO2:  [92 %-98 %] 98 % (10/05 0755)   Recent Labs Lab 09/22/14 1603 09/22/14 2006 09/22/14 2355 09/23/14 0425 09/23/14 0756  GLUCAP 114* 125* 94 113* 114*    Recent Labs Lab 09/22/14 0401 09/22/14 0408  NA 138 139  K 4.0 3.8  CL 101 103  CO2 26  --   GLUCOSE 148* 150*  BUN 18 18  CREATININE 0.74 0.80  CALCIUM 9.3  --     Recent Labs Lab 09/22/14 0401  AST 23  ALT 15  ALKPHOS 56  BILITOT 0.4  PROT 7.0  ALBUMIN 3.7    Recent Labs Lab 09/22/14 0401 09/22/14 0408  WBC 5.3  --   NEUTROABS 2.7  --   HGB 13.0 14.3  HCT 39.4 42.0  MCV 87.9  --   PLT 149*  --    No results found for this basename: CKTOTAL, CKMB, CKMBINDEX, TROPONINI,  in the last 168 hours  Recent Labs  09/22/14 0401  LABPROT 13.2  INR 1.00    Recent Labs  09/22/14 0418  COLORURINE YELLOW  LABSPEC  1.011  PHURINE 5.5  GLUCOSEU 250*  HGBUR TRACE*  BILIRUBINUR NEGATIVE  KETONESUR NEGATIVE  PROTEINUR NEGATIVE  UROBILINOGEN 0.2  NITRITE NEGATIVE  LEUKOCYTESUR NEGATIVE       Component Value Date/Time   CHOL 156 09/22/2014 0402   TRIG 80 09/22/2014 0402   HDL 48 09/22/2014 0402   CHOLHDL 3.3 09/22/2014 0402   VLDL 16 09/22/2014 0402   LDLCALC 92 09/22/2014 0402   Lab Results  Component Value Date   HGBA1C 6.0* 09/22/2014      Component Value Date/Time   LABOPIA NONE DETECTED 09/22/2014 0418   COCAINSCRNUR NONE DETECTED 09/22/2014 0418   LABBENZ NONE DETECTED 09/22/2014 0418   AMPHETMU NONE DETECTED 09/22/2014 0418   THCU NONE DETECTED 09/22/2014 0418   LABBARB NONE DETECTED 09/22/2014 0418     Recent Labs Lab 09/22/14 0401  ETH <11    Ct Head Wo Contrast 09/22/2014   No acute intracranial abnormalities. Mild atrophy and small vessel ischemic changes.    Mr Brain Wo Contrast 09/22/2014   Widespread areas of restricted diffusion are seen throughout the brain, most notable in the LEFT paramedian pons, consistent with multiple areas of acute infarction. No areas of hemorrhagic transformation. These lie within different vascular territories. A shower of emboli is not  excluded.   Mr Maxine Glenn Head/brain Wo Cm 09/22/2014   No proximal flow reducing lesion is evident on intracranial MRA.     Carotid Doppler  No evidence of hemodynamically significant internal carotid artery stenosis. Vertebral artery flow is antegrade.   2D Echocardiogram  EF 55-60% with no source of embolus. Mild concentric hypertrophy.   PHYSICAL EXAM Pleasant elderly Hispanic lady currently not in distress.Awake alert. Afebrile. Head is nontraumatic. Neck is supple without bruit. Hearing is normal. Cardiac exam no murmur or gallop. Lungs are clear to auscultation. Distal pulses are well felt. Neurological Exam ; exam limited due to requiring an interpreter Awake  Alert oriented x 3. Normal speech and language.eye  movements full without nystagmus.fundi were not visualized. Vision acuity and fields appear normal. Hearing is normal. Palatal movements are normal. Face symmetric. Tongue midline. Normal strength, tone, reflexes and coordination. Normal sensation. Gait deferred.   ASSESSMENT/PLAN Jean Ward is a 80 y.o. female with history of diabetes mellitus and hyperlipidemia presenting with new onset slurred speech and right hemiparesis. She did receive IV t-PA 09/22/2014 at 0413. Initial CT imaging unremarkable.     Stroke:  suspect left brain infarct s/p IV tPA, infarct secondary to unknown source, workup pending. Significant improvement post tPA. MRI  Widespread areas of restricted diffusion are seen throughout the  brain, most notable in the LEFT paramedian pons, consistent with  multiple areas of acute infarction.     MRA  unremarkable  Carotid Doppler  No significant stenosis  2D Echo  No source of embolus TEE to look for embolic source. Arranged with [facility] for tomorrow.  If positive for PFO (patent foramen ovale), check bilateral lower extremity venous dopplers to rule out DVT as possible source of stroke. (I have made patient NPO after midnight tonight). If TEE negative, a [facility] electrophysiologist will consult and consider placement of an implantable loop recorder to evaluate for atrial fibrillation as etiology of stroke. This has been explained to patient/family by Dr. Pearlean Brownie and they are agreeable. Patient reports she has been having palpitations.  aspirin 81 mg orally every day prior to admission, now on aspirin 325 mg orally every day. Will change to plavix 75 mg daily.  SCDs for VTE prophylaxis  General thin liquids  OOB.   Resultant aphasia has resolved, mild right hemiparesis arm remains  Therapy recommendations:  pending   Ongoing aggressive risk factor management  Risk factor education  transfer to the floor    Disposition:  Pending.   Hyperlipidemia  Home meds:  Lovaza, zocor  LDL 92  Continue statin  Diabetes  HgbA1c 6.0, at Goal < 7.0  Add CBGs, SSI  Other Stroke Risk Factors Advanced age  Other Active Problems  GERD on home prilosec  Baseline right vision deficit  Hospital day # 1  SHARON BIBY, MSN, RN, ANVP-BC, ANP-BC, [facility] Pager: [phone] 09/23/2014 9:13 AM  I have personally examined this patient, reviewed notes, independently viewed imaging studies, participated in medical decision making and plan of care. I have made any additions or clarifications directly to the above note. Agree with note above. Transfer to the floor today. Mobilize out of bed and therapy consults. She will need TEE and loop recorder  Delia Heady, MD Medical Director [facility] Pager: [phone] 09/23/2014 3:39 PM    To contact Stroke Continuity provider, please refer to [email or web address]. After hours, contact General Neurology

## 2014-09-23 NOTE — Progress Notes (Signed)
Pt arrived to the unit.  Pt welcomed and settled in.  Admission vitals stable.  Telemetry applied and assessment done.  Will continue to monitor. Sondra ComeSilva, Linsi Humann M, RN 09/23/2014

## 2014-09-24 ENCOUNTER — Encounter (HOSPITAL_COMMUNITY): Payer: Self-pay | Admitting: *Deleted

## 2014-09-24 ENCOUNTER — Encounter (HOSPITAL_COMMUNITY)
Admission: EM | Disposition: A | Discharge status: Rehabilitation Facility | Payer: Self-pay | Source: Home / Self Care | Attending: Neurology

## 2014-09-24 DIAGNOSIS — I349 Nonrheumatic mitral valve disorder, unspecified: Secondary | ICD-10-CM

## 2014-09-24 HISTORY — PX: TEE WITHOUT CARDIOVERSION: SHX5443

## 2014-09-24 LAB — GLUCOSE, CAPILLARY
GLUCOSE-CAPILLARY: 124 mg/dL — AB (ref 70–99)
GLUCOSE-CAPILLARY: 107 mg/dL — AB (ref 70–99)
GLUCOSE-CAPILLARY: 99 mg/dL (ref 70–99)
Glucose-Capillary: 119 mg/dL — ABNORMAL HIGH (ref 70–99)
Glucose-Capillary: 129 mg/dL — ABNORMAL HIGH (ref 70–99)

## 2014-09-24 SURGERY — ECHOCARDIOGRAM, TRANSESOPHAGEAL
Anesthesia: Moderate Sedation

## 2014-09-24 MED ORDER — FENTANYL CITRATE 0.05 MG/ML IJ SOLN
INTRAMUSCULAR | Status: AC
  Filled 2014-09-24: qty 2

## 2014-09-24 MED ORDER — MIDAZOLAM HCL 10 MG/2ML IJ SOLN
INTRAMUSCULAR | Status: DC | PRN
  Administered 2014-09-24: 1 mg via INTRAVENOUS
  Administered 2014-09-24: 2 mg via INTRAVENOUS
  Administered 2014-09-24: 1 mg via INTRAVENOUS

## 2014-09-24 MED ORDER — SODIUM CHLORIDE 0.9 % IV SOLN
INTRAVENOUS | Status: DC
  Administered 2014-09-24: 500 mL via INTRAVENOUS

## 2014-09-24 MED ORDER — FENTANYL CITRATE 0.05 MG/ML IJ SOLN
INTRAMUSCULAR | Status: DC | PRN
  Administered 2014-09-24: 25 ug via INTRAVENOUS

## 2014-09-24 MED ORDER — SODIUM CHLORIDE 0.9 % IV SOLN
INTRAVENOUS | Status: DC
Start: 2014-09-24 — End: 2014-09-24

## 2014-09-24 MED ORDER — INSULIN ASPART 100 UNIT/ML ~~LOC~~ SOLN
0.0000 [IU] | Freq: Three times a day (TID) | SUBCUTANEOUS | Status: DC
  Administered 2014-09-25 – 2014-09-26 (×2): 2 [IU] via SUBCUTANEOUS

## 2014-09-24 MED ORDER — BUTAMBEN-TETRACAINE-BENZOCAINE 2-2-14 % EX AERO
INHALATION_SPRAY | CUTANEOUS | Status: DC | PRN
  Administered 2014-09-24: 2 via TOPICAL

## 2014-09-24 MED ORDER — MIDAZOLAM HCL 5 MG/ML IJ SOLN
INTRAMUSCULAR | Status: AC
  Filled 2014-09-24: qty 2

## 2014-09-24 NOTE — Interval H&P Note (Signed)
History and Physical Interval Note:  09/24/2014 8:21 AM  Jean Ward  has presented today for surgery, with the diagnosis of stroke  The various methods of treatment have been discussed with the patient and family. After consideration of risks, benefits and other options for treatment, the patient has consented to  Procedure(s): TRANSESOPHAGEAL ECHOCARDIOGRAM (TEE) (N/A) as a surgical intervention .  The patient's history has been reviewed, patient examined, no change in status, stable for surgery.  I have reviewed the patient's chart and labs.  Questions were answered to the patient's satisfaction.     Lars MassonNELSON, [place] Throckmorton H

## 2014-09-24 NOTE — CV Procedure (Signed)
     Transesophageal Echocardiogram Note  Jean Ward [id number] 1933-04-10  Procedure: Transesophageal Echocardiogram Indications: CVA  Procedure Details Consent: Obtained Time Out: Verified patient identification, verified procedure, site/side was marked, verified correct patient position, special equipment/implants available, Radiology Safety Procedures followed,  medications/allergies/relevent history reviewed, required imaging and test results available.  Performed  Medications: Fentanyl: 25 mcg Versed: 2 mg  Left Ventrical:   The cavity size was normal. There was mild concentric hypertrophy. Systolic function was normal. The estimated ejection fraction was in the range of 60-65%. Wall motion was normal; there were no regional wall motion abnormalities. Doppler parameters are consistent with abnormal left ventricular relaxation (grade 1 diastolic dysfunction).  Mild MR.  Mild TR.  No thrombus in LA or appendage.   Positive bubble study by agitated saline.   Full report to follow.  Complications: No apparent complications Patient did tolerate procedure well.  Jean Ward, Jean Ward H, MD, [facility] 09/24/2014, 8:21 AM

## 2014-09-24 NOTE — Progress Notes (Signed)
Physical Therapy Treatment Patient Details Name: Meilani Edmundson MRN: [id number] DOB: 01-24-1933 Today's Date: 09/24/2014    History of Present Illness 80 y.o. female with a history of GERD, diabetes mellitus and hyperlipidemia presenting with acute slurred speech and right facial droop as well as weakness of right arm and right leg. Initial head CT showed no acute bleed. MD suspects left brain infarct. TPA administered. NIH score 13. Neuro workup underway.    PT Comments    Patient slowly improving in R motor control, demonstrates  decreased balance during standing and functional mobility. Patien can benefit from CIR consult.   Follow Up Recommendations  CIR     Equipment Recommendations  Other (comment)    Recommendations for Other Services Rehab consult     Precautions / Restrictions Precautions Precautions: Fall    Mobility  Bed Mobility Overal bed mobility: Needs Assistance Bed Mobility: Supine to Sit;Sit to Supine;Rolling Rolling: Min guard   Supine to sit: Min assist Sit to supine: Min guard   General bed mobility comments: increased time to perform, min assist  and tactile cues for activity completion.   Transfers Overall transfer level: Needs assistance Equipment used: Rolling walker (2 wheeled);1 person hand held assist Transfers: Sit to/from [employer] Sit to Stand: Min assist Stand pivot transfers: Min assist       General transfer comment: cues for safety, extra time for activity, slower responses.   Ambulation/Gait Ambulation/Gait assistance: Min assist;Mod assist Ambulation Distance (Feet): 180 Feet (then mod assist for balance with 1 HHA, lost balance several times while walking and talking .) Assistive device: Rolling walker (2 wheeled) Gait Pattern/deviations: Step-through pattern;Step-to pattern;Staggering right;Drifts right/left Gait velocity: decreased Gait velocity interpretation: <1.8 ft/sec, indicative of risk for  recurrent falls General Gait Details: much less steady/balance without use of RW, staggers without support, unable to take steps without UE support.   Stairs            Wheelchair Mobility    Modified Rankin (Stroke Patients Only)       Balance Overall balance assessment: Needs assistance Sitting-balance support: Feet supported;No upper extremity supported Sitting balance-Leahy Scale: Fair     Standing balance support: No upper extremity supported;During functional activity Standing balance-Leahy Scale: Poor Standing balance comment: R leanining,  moves feet to attempt regain of balance.requires at least 1 extremety for static standing.                    Cognition Arousal/Alertness: Awake/alert Behavior During Therapy: WFL for tasks assessed/performed Overall Cognitive Status: Within Functional Limits for tasks assessed (follows commands in Spanish)                      Exercises General Exercises - Lower Extremity Ankle Circles/Pumps: AROM;Both;10 reps;Seated Long Arc Quad: AROM;Both;10 reps;Seated Hip ABduction/ADduction: Seated Hip Flexion/Marching: AROM;Both;10 reps;Seated Other Exercises Other Exercises: all exercises performed reciprocal and individually    General Comments        Pertinent Vitals/Pain      Home Living                      Prior Function            PT Goals (current goals can now be found in the care plan section) Progress towards PT goals: Progressing toward goals    Frequency  Min 4X/week    PT Plan Current plan remains appropriate    Co-evaluation  End of Session Equipment Utilized During Treatment: Gait belt Activity Tolerance: Patient tolerated treatment well Patient left: in bed;with nursing/sitter in room;with family/visitor present (going to TEE)     Time: [id number] PT Time Calculation (min): 37 min  Charges:  $Neuromuscular Re-education: 23-37 mins                     G Codes:      Rada HayHill, Hazael Olveda Elizabeth 09/24/2014, 11:23 AM Blanchard KelchKaren Mykai Wendorf PT [phone]

## 2014-09-24 NOTE — H&P (View-Only) (Signed)
Physical Medicine and Rehabilitation Consult Reason for Consult:strok Referring Physician:  sethi   HPI: Jean Ward is a 80 y.o. Hispanic female with history of DM type 2, hyperlipidemia who was admitted on 09/22/14 with slurred speech, right sided weakness and right facial droop. CT head negative for abnormality and she was treated with tPA with improvements. MRI/MRA brain done revealing widespread areas of restricted diffusion are seen throughout the brain, most notable in the LEFT paramedian pons, consistent with multiple areas of acute infarction question emboli. 2D echo with EF 55-60% and no wall abnormality. Carotid dopplers without significant ICA stenosis. ST evaluation done revealing mild dysarthria and intact cognitive linguistic function.     ROS Past Medical History  Diagnosis Date  . Diabetes mellitus without complication   . Hypercholesteremia    Past Surgical History  Procedure Laterality Date  . Hernia repair     No family history on file. Social History:  reports that she has never smoked. She does not have any smokeless tobacco history on file. Her alcohol and drug histories are not on file. Allergies: No Known Allergies Medications Prior to Admission  Medication Sig Dispense Refill  . aspirin EC 81 MG tablet Take 81 mg by mouth daily.      Marland Kitchen omega-3 acid ethyl esters (LOVAZA) 1 G capsule Take 1 g by mouth daily.      Marland Kitchen omeprazole (PRILOSEC) 20 MG capsule Take 40 mg by mouth daily.      . simvastatin (ZOCOR) 40 MG tablet Take 40 mg by mouth daily.      . sitaGLIPtin (JANUVIA) 100 MG tablet Take 100 mg by mouth daily.        Home: Home Living Family/patient expects to be discharged to:: Private residence Living Arrangements: Children  Functional History:   Functional Status:  Mobility:          ADL:    Cognition: Cognition Overall Cognitive Status: Within Functional Limits for tasks assessed Orientation Level: Oriented  X4 Cognition Overall Cognitive Status: Within Functional Limits for tasks assessed  Blood pressure 123/49, pulse 70, temperature 98 F (36.7 C), temperature source Oral, resp. rate 15, height 5' (1.524 m), weight 82.6 kg (182 lb 1.6 oz), SpO2 94.00%. Physical Exam  Results for orders placed during the hospital encounter of 09/22/14 (from the past 24 hour(s))  GLUCOSE, CAPILLARY     Status: Abnormal   Collection Time    09/22/14 12:06 PM      Result Value Ref Range   Glucose-Capillary 142 (*) 70 - 99 mg/dL  GLUCOSE, CAPILLARY     Status: Abnormal   Collection Time    09/22/14  4:03 PM      Result Value Ref Range   Glucose-Capillary 114 (*) 70 - 99 mg/dL  GLUCOSE, CAPILLARY     Status: Abnormal   Collection Time    09/22/14  8:06 PM      Result Value Ref Range   Glucose-Capillary 125 (*) 70 - 99 mg/dL  GLUCOSE, CAPILLARY     Status: None   Collection Time    09/22/14 11:55 PM      Result Value Ref Range   Glucose-Capillary 94  70 - 99 mg/dL  GLUCOSE, CAPILLARY     Status: Abnormal   Collection Time    09/23/14  4:25 AM      Result Value Ref Range   Glucose-Capillary 113 (*) 70 - 99 mg/dL  GLUCOSE, CAPILLARY     Status:  Abnormal   Collection Time    09/23/14  7:56 AM      Result Value Ref Range   Glucose-Capillary 114 (*) 70 - 99 mg/dL   Comment 1 Notify RN     Comment 2 Documented in Chart     Ct Head Wo Contrast  09/23/2014   CLINICAL DATA:  Followup stroke.  24 hr status post tPA.  EXAM: CT HEAD WITHOUT CONTRAST  TECHNIQUE: Contiguous axial images were obtained from the base of the skull through the vertex without intravenous contrast.  COMPARISON:  Prior MRI from 09/22/2014  FINDINGS: Multiple previously identified small ischemic infarcts involving the left paramedian pons, right occipital lobe, temporal lobe, left frontal and left temporal lobes are not well visualized. No evidence of hemorrhagic transformation status post tPA. No definite new large vessel territory  infarct.  No midline shift. No hydrocephalus. No extra-axial fluid collection.  Calvarium intact.  Scalp soft tissues unremarkable.  No acute abnormality seen about either orbit.  Paranasal sinuses and mastoid air cells are clear.  IMPRESSION: 1. Poor visualization of previously identified small multi focal acute ischemic infarcts involving the brain as seen on prior MRI from 09/22/2014. No evidence of hemorrhagic transformation or other bleed status post tPA. 2. No new acute intracranial process.   Electronically Signed   By: Rise MuBenjamin  McClintock M.D.   On: 09/23/2014 05:45   Ct Head Wo Contrast  09/22/2014   CLINICAL DATA:  Code stroke. Slurred speech and right-sided weakness beginning at 2 a.m.  EXAM: CT HEAD WITHOUT CONTRAST  TECHNIQUE: Contiguous axial images were obtained from the base of the skull through the vertex without intravenous contrast.  COMPARISON:  None.  FINDINGS: Diffuse cerebral atrophy. No significant ventricular dilatation. Mild low-attenuation changes in the deep white matter suggesting small vessel ischemia. No mass effect or midline shift. No abnormal extra-axial fluid collections. Gray-white matter junctions are distinct. Basal cisterns are not effaced. No evidence of acute intracranial hemorrhage. No depressed skull fractures. Visualized paranasal sinuses and mastoid air cells are not opacified.  IMPRESSION: No acute intracranial abnormalities. Mild atrophy and small vessel ischemic changes.  These results were called by telephone at the time of interpretation on 09/22/2014 at 3:52 am to Dr. Rochele RaringKRISTEN WARD , who verbally acknowledged these results.   Electronically Signed   By: Burman NievesWilliam  Stevens M.D.   On: 09/22/2014 03:53   Mr Brain Wo Contrast  09/22/2014   CLINICAL DATA:  RIGHT-sided weakness and dysarthria which began at midnight 09/22/2014. Stroke risk factors include diabetes and hypertension. TPA was administered  EXAM: MRI HEAD WITHOUT CONTRAST  MRA HEAD WITHOUT CONTRAST   TECHNIQUE: Multiplanar, multiecho pulse sequences of the brain and surrounding structures were obtained without intravenous contrast. Angiographic images of the head were obtained using MRA technique without contrast.  COMPARISON:  CT head earlier in the day  FINDINGS: MRI HEAD FINDINGS  Widespread areas of restricted diffusion are seen throughout the brain consistent with multiple areas of acute infarction. These include the LEFT paramedian pons, RIGHT occipital lobe, RIGHT posterior temporal lobe, LEFT posterior frontal cortex, and LEFT posterior temporal lobe. There are no areas of hemorrhage, mass lesion, hydrocephalus, or extra-axial fluid. Mild cerebral and cerebellar atrophy is present. Mild subcortical and periventricular T2 and FLAIR hyperintensities, likely chronic microvascular ischemic change. Normal pituitary and cerebellar tonsils. Mild pannus. Mild cervical spondylosis. Flow voids are maintained. BILATERAL cataract extraction. No sinus or mastoid disease. Compared with prior CT, the infarcts are not visible.  MRA HEAD  FINDINGS  The internal carotid arteries are widely patent. The basilar artery is widely patent with the LEFT vertebral the dominant/sole contributor. RIGHT vertebral contributes to PICA. There is no visible intracranial stenosis or berry aneurysm. No distal MCA and PCA irregularity. 2 mm medial wide necked outpouching from the superior cavernous LEFT ICA consistent with atheromatous disease.  IMPRESSION: Widespread areas of restricted diffusion are seen throughout the brain, most notable in the LEFT paramedian pons, consistent with multiple areas of acute infarction. No areas of hemorrhagic transformation. These lie within different vascular territories. A shower of emboli is not excluded. See discussion above.  No proximal flow reducing lesion is evident on intracranial MRA.   Electronically Signed   By: Davonna Belling M.D.   On: 09/22/2014 16:46   Mr Maxine Glenn Head/brain Wo Cm  09/22/2014    CLINICAL DATA:  RIGHT-sided weakness and dysarthria which began at midnight 09/22/2014. Stroke risk factors include diabetes and hypertension. TPA was administered  EXAM: MRI HEAD WITHOUT CONTRAST  MRA HEAD WITHOUT CONTRAST  TECHNIQUE: Multiplanar, multiecho pulse sequences of the brain and surrounding structures were obtained without intravenous contrast. Angiographic images of the head were obtained using MRA technique without contrast.  COMPARISON:  CT head earlier in the day  FINDINGS: MRI HEAD FINDINGS  Widespread areas of restricted diffusion are seen throughout the brain consistent with multiple areas of acute infarction. These include the LEFT paramedian pons, RIGHT occipital lobe, RIGHT posterior temporal lobe, LEFT posterior frontal cortex, and LEFT posterior temporal lobe. There are no areas of hemorrhage, mass lesion, hydrocephalus, or extra-axial fluid. Mild cerebral and cerebellar atrophy is present. Mild subcortical and periventricular T2 and FLAIR hyperintensities, likely chronic microvascular ischemic change. Normal pituitary and cerebellar tonsils. Mild pannus. Mild cervical spondylosis. Flow voids are maintained. BILATERAL cataract extraction. No sinus or mastoid disease. Compared with prior CT, the infarcts are not visible.  MRA HEAD FINDINGS  The internal carotid arteries are widely patent. The basilar artery is widely patent with the LEFT vertebral the dominant/sole contributor. RIGHT vertebral contributes to PICA. There is no visible intracranial stenosis or berry aneurysm. No distal MCA and PCA irregularity. 2 mm medial wide necked outpouching from the superior cavernous LEFT ICA consistent with atheromatous disease.  IMPRESSION: Widespread areas of restricted diffusion are seen throughout the brain, most notable in the LEFT paramedian pons, consistent with multiple areas of acute infarction. No areas of hemorrhagic transformation. These lie within different vascular territories. A shower of  emboli is not excluded. See discussion above.  No proximal flow reducing lesion is evident on intracranial MRA.   Electronically Signed   By: Davonna Belling M.D.   On: 09/22/2014 16:46    Assessment/Plan: Diagnosis: embolic CVA   RECOMMENDATIONS: This patient's condition is appropriate for continued rehabilitative care in the following setting: [facility] Therapy and Outpatient Therapy Patient has agreed to participate in recommended program. Potentially Note that insurance prior authorization may be required for reimbursement for recommended care.  Comment: Pt is min guard assist hospital day #2. Should continue to improve. Does not require CIR -level rehab.  Ranelle Oyster, MD, [facility] [address] [facility] Physical Medicine & Rehabilitation 09/23/2014     09/23/2014

## 2014-09-24 NOTE — Progress Notes (Addendum)
Please refer to next note. Delfino Lovettichie Caitlynn Ju, RN, BSN 09/24/2014 5:10 AM

## 2014-09-24 NOTE — Progress Notes (Signed)
  Echocardiogram Echocardiogram Transesophageal has been performed.  Jean Ward, Jean Ward 09/24/2014, 2:06 PM

## 2014-09-24 NOTE — Progress Notes (Signed)
09/24/14 0254 09/24/14 0312  What Happened  Was fall witnessed? No --   Was patient injured? No --   Patient found on floor --   Found by Staff-comment (NT) --   Stated prior activity other (comment) (1 assist to transfer) --   Follow Up  MD notified Noel Christmas, MD --   Time MD notified [phone] --   Family notified Yes-comment (will call at 6 am) --   Time family notified 0600 --   Additional tests No --   Simple treatment Other (comment) (no treatment needed) --   Progress note created (see row info) Yes --   Adult Fall Risk Assessment  Risk Factor Category (scoring not indicated) Fall has occurred during this admission (document High fall risk) --   Age 80 --   Fall History: Fall within 6 months prior to admission 0 --   Elimination; Bowel and/or Urine Incontinence 0 --   Elimination; Bowel and/or Urine Urgency/Frequency 0 --   Medications: includes PCA/Opiates, Anti-convulsants, Anti-hypertensives, Diuretics, Hypnotics, Laxatives, Sedatives, and Psychotropics 0 --   Patient Care Equipment 1 --   Mobility-Assistance 2 --   Mobility-Gait 2 --   Mobility-Sensory Deficit 0 --   Cognition-Awareness 0 --   Cognition-Impulsiveness 0 --   Cognition-Limitations 0 --   Total Score 7 --   Patient's Fall Risk High Fall Risk (>13 points) --   Adult Fall Risk Interventions  Required Bundle Interventions *See Row Information* High fall risk - low, moderate, and high requirements implemented --   Additional Interventions Fall risk signage --   Vitals  Temp --  98 F (36.7 C)  Temp Source --  Oral  BP --  ! 134/53 mmHg  BP Location --  Left arm  BP Method --  Automatic  Patient Position (if appropriate) --  Lying  Pulse Rate --  71  Pulse Rate Source --  Dinamap  Resp --  ! 22  Oxygen Therapy  SpO2 --  94 %  O2 Device --  None (Room air)  Pain Assessment  Pain Assessment No/denies pain --   PCA/Epidural/Spinal Assessment  Respiratory Pattern Regular;Unlabored --    Neurological  Neuro (WDL) WDL --   Level of Consciousness Alert --   Orientation Level Oriented X4 --   Cognition Appropriate at baseline;Follows commands --   Speech Clear --   Pupil Assessment  Yes --   R Pupil Size (mm) 3 --   R Pupil Shape Round --   R Pupil Reaction Brisk --   L Pupil Size (mm) 3 --   L Pupil Shape Round --   L Pupil Reaction Brisk --   Additional Pupil Assessments No --   Motor Function/Sensation Assessment Grip;Dorsiflexion;Plantar flexion;Motor response;Motor strength --   R Hand Grip Present;Weak --   L Hand Grip Present;Moderate  --   R Foot Dorsiflexion Present;Weak --   L Foot Dorsiflexion Present;Moderate --   R Foot Plantar Flexion Present;Weak --   L Foot Plantar Flexion Present;Moderate --   RUE Motor Response Purposeful movement --   RUE Motor Strength 4 --   LUE Motor Response Purposeful movement --   LUE Motor Strength 4 --   RLE Motor Response Purposeful movement --   RLE Motor Strength 4 --   LLE Motor Response Purposeful movement --   LLE Motor Strength 4 --   Neuro Symptoms None --   Neuro Additional Assessments No --   NIH Stroke Scale  Level of Consciousness (  1a. ) 0 --   LOC Questions (1b. ) 0 --   LOC Commands (1c. ) 0 --   Best Gaze (2. ) 0 --   Visual (3. ) 0 --   Facial Palsy (4. ) 0 --   Motor Arm, Left (5a. ) 0 --   Motor Arm, Right (5b. ) 0 --   Motor Leg, Left (6a. ) 0 --   Motor Leg, Right (6b. ) 0 --   Limb Ataxia (7. ) 0 --   Sensory (8. ) 0 --   Best Language (9. ) 0 --   Dysarthria (10. ) 0 --   Inattention/Extinction 0 --   Total 0 --   Musculoskeletal  Musculoskeletal (WDL) X --   Assistive Device None --   Generalized Weakness Yes --   Weight Bearing Restrictions No --   Integumentary  Integumentary (WDL) X --   Skin Color Appropriate for ethnicity --   Skin Condition Dry --   Skin Integrity Intact;Ecchymosis --   Ecchymosis Location Hand;Arm;Back --   Ecchymosis Location Orientation Other  (Comment) (left arm + hand, right back) --   Skin Turgor Non-tenting --

## 2014-09-24 NOTE — Progress Notes (Signed)
Occupational Therapy Treatment Patient Details Name: Shenoa Hattabaugh MRN: [id number] DOB: 1933/02/16 Today's Date: 09/24/2014    History of present illness 80 y.o. female with a history of GERD, diabetes mellitus and hyperlipidemia presenting with acute slurred speech and right facial droop as well as weakness of right arm and right leg. Initial head CT showed no acute bleed. MD suspects left brain infarct. TPA administered. NIH score 13. Neuro workup underway.   OT comments  Pt. Progressing well with skilled therapies.  Ambulates min a with HHA.  Tactile guidance still required secondary to poor balance.  Min a for all components of bed mobility.  Following gestural cues well, secondary to language barrier.  Very pleasant and motivated.  Will benefit from continued therapy for increasing safety and independence with ADLS and to decrease caregiver burden at d/c.  Follow Up Recommendations  CIR;Supervision/Assistance - 24 hour    Equipment Recommendations  3 in 1 bedside comode;Tub/shower bench    Recommendations for Other Services Rehab consult    Precautions / Restrictions Precautions Precautions: Fall Precaution Comments: r inattention       Mobility Bed Mobility Overal bed mobility: Needs Assistance Bed Mobility: Supine to Sit;Sit to Supine;Sit to Sidelying Rolling: Min assist   Supine to sit: Min assist Sit to supine: Min assist   General bed mobility comments: increased time to perform, min assist  and tactile cues for activity completion.   Transfers Overall transfer level: Needs assistance Equipment used: 1 person hand held assist Transfers: Sit to/from [employer] Sit to Stand: Min guard Stand pivot transfers: Min guard       General transfer comment: cues for safety, extra time for activity, slower responses.     Balance Overall balance assessment: Needs assistance   Sitting balance-Leahy Scale: Fair       Standing balance-Leahy Scale:  Poor                     ADL Overall ADL's : Needs assistance/impaired     Grooming: Wash/dry hands;Minimal assistance;Standing                   Toilet Transfer: Minimal assistance;Ambulation;Regular Toilet   Toileting- Clothing Manipulation and Hygiene: Minimal assistance;Sit to/from stand         General ADL Comments: doing better with right side awareness this visit.  demonstrated B hand integration while getting soap and rubbed equally onto both hands.                                                                                                      Pertinent Vitals/ Pain       Pain Assessment: No/denies pain                                                          Frequency Min 3X/week     Progress Toward Goals  OT Goals(current goals can now be found in the care plan section)  Progress towards OT goals: Progressing toward goals     Plan Discharge plan remains appropriate                     End of Session Equipment Utilized During Treatment: Gait belt   Activity Tolerance Patient tolerated treatment well;Other (comment) (dizziness and c/o hunger (was npo prior to today pending procedure early today))   Patient Left in bed;with call bell/phone within reach   Nurse Communication Other (comment) (pt. with c/o dizziness and hunger, began to cry)        Time: [id number] OT Time Calculation (min): 28 min  Charges: OT General Charges $OT Visit: 1 Procedure OT Treatments $Self Care/Home Management : 23-37 mins  Robet LeuMorris, Jeriah Skufca Lorraine, COTA/L 09/24/2014, 4:08 PM

## 2014-09-24 NOTE — Progress Notes (Signed)
STROKE TEAM PROGRESS NOTE   HISTORY Jean Ward is an 80 y.o. female with a history of diabetes mellitus and hyperlipidemia presenting with acute slurred speech and right facial droop as well as weakness of right arm and right leg that started 12:00 AM on 09/22/2014. Patient's been taking aspirin daily. There is no history of stroke or TIA. CT scan of her head showed no acute intracranial abnormality. She was last seen well at midnight. Patient woke up at 2 AM with presenting deficits. NIH stroke score was 13. Patient was deemed a candidate for TPA administration which was started. She was admitted to the neuro ICU for further evaluation and treatment.   SUBJECTIVE (INTERVAL HISTORY) Patient up walking in the halls. Son at the bedside.  OBJECTIVE Temp:  [97.9 F (36.6 C)-98.4 F (36.9 C)] 98 F (36.7 C) (10/06 1330) Pulse Rate:  [58-88] 63 (10/06 1330) Cardiac Rhythm:  [-] Normal sinus rhythm (10/06 0005) Resp:  [13-22] 18 (10/06 1330) BP: (108-172)/(35-93) 136/50 mmHg (10/06 1330) SpO2:  [90 %-100 %] 94 % (10/06 1330) FiO2 (%):  [3 %] 3 % (10/06 1150)   Recent Labs Lab 09/23/14 1740 09/23/14 2218 09/24/14 0015 09/24/14 0412 09/24/14 1307  GLUCAP 112* 122* 119* 129* 107*    Recent Labs Lab 09/22/14 0401 09/22/14 0408  NA 138 139  K 4.0 3.8  CL 101 103  CO2 26  --   GLUCOSE 148* 150*  BUN 18 18  CREATININE 0.74 0.80  CALCIUM 9.3  --     Recent Labs Lab 09/22/14 0401  AST 23  ALT 15  ALKPHOS 56  BILITOT 0.4  PROT 7.0  ALBUMIN 3.7    Recent Labs Lab 09/22/14 0401 09/22/14 0408  WBC 5.3  --   NEUTROABS 2.7  --   HGB 13.0 14.3  HCT 39.4 42.0  MCV 87.9  --   PLT 149*  --    No results found for this basename: CKTOTAL, CKMB, CKMBINDEX, TROPONINI,  in the last 168 hours  Recent Labs  09/22/14 0401  LABPROT 13.2  INR 1.00    Recent Labs  09/22/14 0418  COLORURINE YELLOW  LABSPEC 1.011  PHURINE 5.5  GLUCOSEU 250*  HGBUR TRACE*  BILIRUBINUR  NEGATIVE  KETONESUR NEGATIVE  PROTEINUR NEGATIVE  UROBILINOGEN 0.2  NITRITE NEGATIVE  LEUKOCYTESUR NEGATIVE       Component Value Date/Time   CHOL 156 09/22/2014 0402   TRIG 80 09/22/2014 0402   HDL 48 09/22/2014 0402   CHOLHDL 3.3 09/22/2014 0402   VLDL 16 09/22/2014 0402   LDLCALC 92 09/22/2014 0402   Lab Results  Component Value Date   HGBA1C 6.0* 09/22/2014      Component Value Date/Time   LABOPIA NONE DETECTED 09/22/2014 0418   COCAINSCRNUR NONE DETECTED 09/22/2014 0418   LABBENZ NONE DETECTED 09/22/2014 0418   AMPHETMU NONE DETECTED 09/22/2014 0418   THCU NONE DETECTED 09/22/2014 0418   LABBARB NONE DETECTED 09/22/2014 0418     Recent Labs Lab 09/22/14 0401  ETH <11    Ct Head Wo Contrast 09/22/2014   No acute intracranial abnormalities. Mild atrophy and small vessel ischemic changes.    Mr Brain Wo Contrast 09/22/2014   Widespread areas of restricted diffusion are seen throughout the brain, most notable in the LEFT paramedian pons, consistent with multiple areas of acute infarction. No areas of hemorrhagic transformation. These lie within different vascular territories. A shower of emboli is not excluded.   Mr Maxine GlennMra Head/brain Wo Cm  09/22/2014   No proximal flow reducing lesion is evident on intracranial MRA.     Carotid Doppler  No evidence of hemodynamically significant internal carotid artery stenosis. Vertebral artery flow is antegrade.   2D Echocardiogram  EF 55-60% with no source of embolus. Mild concentric hypertrophy.   PHYSICAL EXAM Pleasant elderly Hispanic lady currently not in distress.Awake alert. Afebrile. Head is nontraumatic. Neck is supple without bruit. Hearing is normal. Cardiac exam no murmur or gallop. Lungs are clear to auscultation. Distal pulses are well felt. Neurological Exam ; exam limited due to requiring an interpreter Awake  Alert oriented x 3. Normal speech and language.eye movements full without nystagmus.fundi were not visualized. Vision  acuity and fields appear normal. Hearing is normal. Palatal movements are normal. Face symmetric. Tongue midline. Normal strength, tone, reflexes and coordination. Normal sensation. Gait deferred.   ASSESSMENT/PLAN Jean Ward is a 80 y.o. female with history of diabetes mellitus and hyperlipidemia presenting with new onset slurred speech and right hemiparesis. She did receive IV t-PA 09/22/2014 at 0413. Initial CT imaging unremarkable.     Stroke:  suspect left brain infarct s/p IV tPA, infarct secondary to unknown source, workup pending. Significant improvement post tPA.  MRI  Widespread areas of restricted diffusion are seen throughout the brain, most notable in the LEFT paramedian pons, consistent with multiple areas of acute infarction.  MRA  unremarkable  Carotid Doppler  No significant stenosis  2D Echo  No source of embolus TEE to look for embolic source. Arranged with [facility] for today.  If positive for PFO (patent foramen ovale), check bilateral lower extremity venous dopplers to rule out DVT as possible source of stroke.  If TEE negative, a [facility] electrophysiologist will consult and consider placement of an implantable loop recorder to evaluate for atrial fibrillation as etiology of stroke. This has been explained to patient/family by Dr. Pearlean Brownie and they are agreeable. Patient reports she has been having palpitations.  aspirin 81 mg orally every day prior to admission, now on aspirin 325 mg orally every day. Will change to plavix 75 mg daily.  SCDs for VTE prophylaxis  NPO for TEE  OOB.   Resultant aphasia has resolved, mild right hemiparesis arm remains  Therapy recommendations:  CIR  Ongoing aggressive risk factor management  Disposition:  Home with OP or HH therapies, too high level for CIR  Hyperlipidemia  Home meds:  Lovaza, zocor  LDL 92  Continue statin  Diabetes  HgbA1c 6.0, at Goal <  7.0  Follow CBGs, cover w/ SSI  Other Stroke Risk Factors Advanced age  Other Active Problems  GERD on home prilosec  Baseline right vision deficit  Hospital day # 2  SHARON BIBY, MSN, RN, ANVP-BC, ANP-BC, GNP-BC [facility] Pager: [phone] 09/24/2014 2:20 PM  I have personally examined this patient, reviewed notes, independently viewed imaging studies, participated in medical decision making and plan of care. I have made any additions or clarifications directly to the above note. Agree with note above. Transfer to the floor today. Mobilize out of bed and therapy consults. She is getting TEE and loop recorder today  Delia Heady, MD Medical Director [facility] Pager: [phone] 09/24/2014 2:20 PM    To contact Stroke Continuity provider, please refer to [email or web address]. After hours, contact General Neurology

## 2014-09-25 ENCOUNTER — Encounter (HOSPITAL_COMMUNITY): Payer: Self-pay | Admitting: Cardiology

## 2014-09-25 DIAGNOSIS — I639 Cerebral infarction, unspecified: Secondary | ICD-10-CM

## 2014-09-25 LAB — GLUCOSE, CAPILLARY
GLUCOSE-CAPILLARY: 117 mg/dL — AB (ref 70–99)
Glucose-Capillary: 112 mg/dL — ABNORMAL HIGH (ref 70–99)
Glucose-Capillary: 122 mg/dL — ABNORMAL HIGH (ref 70–99)
Glucose-Capillary: 135 mg/dL — ABNORMAL HIGH (ref 70–99)

## 2014-09-25 MED ORDER — PNEUMOCOCCAL VAC POLYVALENT 25 MCG/0.5ML IJ INJ
0.5000 mL | INJECTION | INTRAMUSCULAR | Status: AC
  Administered 2014-09-27: 0.5 mL via INTRAMUSCULAR

## 2014-09-25 MED ORDER — PANTOPRAZOLE SODIUM 40 MG PO TBEC
40.0000 mg | DELAYED_RELEASE_TABLET | Freq: Every day | ORAL | Status: DC
  Administered 2014-09-25 – 2014-09-26 (×2): 40 mg via ORAL
  Filled 2014-09-25 (×2): qty 1

## 2014-09-25 NOTE — Progress Notes (Signed)
Physical Therapy Treatment Patient Details Name: Jean Ward MRN: [id number] DOB: 1933-10-22 Today's Date: 09/25/2014    History of Present Illness 80 y.o. female with a history of GERD, diabetes mellitus and hyperlipidemia presenting with acute slurred speech and right facial droop as well as weakness of right arm and right leg. Initial head CT showed no acute bleed. MD suspects left brain infarct. TPA administered. NIH score 13. Neuro workup underway.    PT Comments    Pt with c/o worsening pain in Rt LE today. Pt appears to demo decr strength in Rt LE and UE today. Pt also c/o "difficulty moving tongue". Pt requiring mod (A) for bed mobility and for mobility today due to balance deficits and weakness. Believe pt should be reconsidered for CIR. Pt is a fall risk at this time. Daughter present for session and very hopeful for IP rehab admission. If pt is not accepted, pt will require SNF and family is aware.   Follow Up Recommendations  CIR     Equipment Recommendations  Other (comment) (TBD)    Recommendations for Other Services Rehab consult     Precautions / Restrictions Precautions Precautions: Fall Precaution Comments: r inattention Restrictions Weight Bearing Restrictions: No    Mobility  Bed Mobility Overal bed mobility: Needs Assistance Bed Mobility: Supine to Sit;Sit to Supine     Supine to sit: Mod assist;HOB elevated Sit to supine: Min assist   General bed mobility comments: attempting to exit Lt side of bed (towards strong side); pt with Rt UE neglect; required mod (A) to elevate trunk and bring Rt Hip to EOB for sitting; pt with heavy lean to Lt at EOB  Transfers Overall transfer level: Needs assistance Equipment used: Rolling walker (2 wheeled) Transfers: Sit to/from Stand Sit to Stand: Mod assist         General transfer comment: pt with difficulty powering up to stand; cues for hand placement and safety; (A) to facilitate Rt UE onto RW    Ambulation/Gait Ambulation/Gait assistance: Mod assist Ambulation Distance (Feet): 100 Feet Assistive device: Rolling walker (2 wheeled) Gait Pattern/deviations: Drifts right/left;Narrow base of support;Shuffle;Decreased stride length;Decreased dorsiflexion - right;Decreased weight shift to right Gait velocity: decreased Gait velocity interpretation: Below normal speed for age/gender General Gait Details: mod (A) to manage RW and maintain balance; pt veering in hallway throughout session and required multimodal cues for upright posture and safety with RW ; pt is a high fall risk    Stairs            Wheelchair Mobility    Modified Rankin (Stroke Patients Only) Modified Rankin (Stroke Patients Only) Pre-Morbid Rankin Score: No symptoms Modified Rankin: Moderately severe disability     Balance Overall balance assessment: Needs assistance;History of Falls (daughter reports recent fall by pt) Sitting-balance support: Feet supported;Single extremity supported Sitting balance-Leahy Scale: Poor Sitting balance - Comments: pt leaning Lt at EOB; required (A) to acheive midline at EOB and UE support at all times  Postural control: Posterior lean Standing balance support: During functional activity;Bilateral upper extremity supported Standing balance-Leahy Scale: Poor Standing balance comment: (A) and RW to maintain balance                     Cognition Arousal/Alertness: Awake/alert Behavior During Therapy: WFL for tasks assessed/performed Overall Cognitive Status: Within Functional Limits for tasks assessed                      Exercises General Exercises -  Lower Extremity Ankle Circles/Pumps: AAROM;10 reps;Supine Long Arc Quad: AROM;Both;10 reps;Seated (multimodal cues) Low Level/ICU Exercises Heel Slides: AAROM;Both;10 reps;Supine;Other (comment);AROM ((A) with Rt LE; decr coordination )    General Comments General comments (skin integrity, edema, etc.):  daughter present to intepret; pt c/o increase pain, numbness and heaviness in Rt LE; c/o incr blurry vision in Rt eye      Pertinent Vitals/Pain Pain Assessment: Faces Faces Pain Scale: Hurts little more Pain Location: Posterior Rt ankle and Rt lower leg Pain Descriptors / Indicators: Heaviness;Numbness Pain Intervention(s): Monitored during session;Repositioned    Home Living                      Prior Function            PT Goals (current goals can now be found in the care plan section) Acute Rehab PT Goals Patient Stated Goal: none stated PT Goal Formulation: With patient/family Time For Goal Achievement: 10/07/14 Potential to Achieve Goals: Good Progress towards PT goals: Progressing toward goals    Frequency  Min 4X/week    PT Plan Current plan remains appropriate    Co-evaluation             End of Session Equipment Utilized During Treatment: Gait belt Activity Tolerance: Patient limited by fatigue;Patient limited by pain Patient left: in bed;with call bell/phone within reach;with chair alarm set;with family/visitor present     Time: 1191-47821311-1335 PT Time Calculation (min): 24 min  Charges:  $Gait Training: 8-22 mins $Therapeutic Exercise: 8-22 mins                    G CodesDonell Sievert:      Jakyron Fabro N, [place]PT  [phone] 09/25/2014, 1:46 PM

## 2014-09-25 NOTE — Progress Notes (Signed)
UR complete.  Kenta Laster RN, MSN 

## 2014-09-25 NOTE — Care Management Note (Signed)
    Page 1 of 1   09/25/2014     1:25:21 PM CARE MANAGEMENT NOTE 09/25/2014  Patient:  Quin HoopRIMINIO,Addison   Account Number:  [id number]  Date Initiated:  09/25/2014  Documentation initiated by:  Elmer BalesOBARGE,COURTNEY  Subjective/Objective Assessment:   Patient was admitted with CVA. Lives at home with adult children.     Action/Plan:   Will follow for discharge needs pending PT/OT evals and physician orders.   Anticipated DC Date:     Anticipated DC Plan:  HOME W HOME HEALTH SERVICES         Choice offered to / List presented to:             Status of service:  In process, will continue to follow Medicare Important Message given?  YES (If response is "NO", the following Medicare IM given date fields will be blank) Date Medicare IM given:  09/25/2014 Medicare IM given by:  Elmer BalesOBARGE,COURTNEY Date Additional Medicare IM given:   Additional Medicare IM given by:    Discharge Disposition:    Per UR Regulation:  Reviewed for med. necessity/level of care/duration of stay  If discussed at Long Length of Stay Meetings, dates discussed:    Comments:  09/25/14 1135 Elmer Balesourtney Robarge RN, MSN, CM- Medicare IM letter provided. Patient's daughter shared that she is hopeful for CIR. Progress note from Dr Riley KillSwartz stated that patient may be [facility] or [facility].  CM discussed the need for back up options with daughter. CSW aware.

## 2014-09-25 NOTE — Progress Notes (Addendum)
STROKE TEAM PROGRESS NOTE   HISTORY Jean Ward is an 80 y.o. female with a history of diabetes mellitus and hyperlipidemia presenting with acute slurred speech and right facial droop as well as weakness of right arm and right leg that started 12:00 AM on 09/22/2014. Patient's been taking aspirin daily. There is no history of stroke or TIA. CT scan of her head showed no acute intracranial abnormality. She was last seen well at midnight. Patient woke up at 2 AM with presenting deficits. NIH stroke score was 13. Patient was deemed a candidate for TPA administration which was started. She was admitted to the neuro ICU for further evaluation and treatment.   SUBJECTIVE (INTERVAL HISTORY) Patient with worsening of right arm hemiparesis over night. Was too good for CIR, now think she will meet criteria. Talked with rehab admissions coordinator.  OBJECTIVE Temp:  [97.1 F (36.2 C)-98.4 F (36.9 C)] 97.1 F (36.2 C) (10/07 1034) Pulse Rate:  [58-88] 84 (10/07 1034) Cardiac Rhythm:  [-] Normal sinus rhythm (10/07 0800) Resp:  [13-20] 18 (10/07 1034) BP: (113-172)/(35-93) 113/49 mmHg (10/07 1034) SpO2:  [90 %-100 %] 95 % (10/07 1034) FiO2 (%):  [3 %] 3 % (10/06 1150)   Recent Labs Lab 09/24/14 0813 09/24/14 1307 09/24/14 1558 09/24/14 2225 09/25/14 0642  GLUCAP 124* 107* 99 112* 122*    Recent Labs Lab 09/22/14 0401 09/22/14 0408  NA 138 139  K 4.0 3.8  CL 101 103  CO2 26  --   GLUCOSE 148* 150*  BUN 18 18  CREATININE 0.74 0.80  CALCIUM 9.3  --     Recent Labs Lab 09/22/14 0401  AST 23  ALT 15  ALKPHOS 56  BILITOT 0.4  PROT 7.0  ALBUMIN 3.7    Recent Labs Lab 09/22/14 0401 09/22/14 0408  WBC 5.3  --   NEUTROABS 2.7  --   HGB 13.0 14.3  HCT 39.4 42.0  MCV 87.9  --   PLT 149*  --    No results found for this basename: CKTOTAL, CKMB, CKMBINDEX, TROPONINI,  in the last 168 hours No results found for this basename: LABPROT, INR,  in the last 72 hours No  results found for this basename: COLORURINE, APPERANCEUR, LABSPEC, PHURINE, GLUCOSEU, HGBUR, BILIRUBINUR, KETONESUR, PROTEINUR, UROBILINOGEN, NITRITE, LEUKOCYTESUR,  in the last 72 hours     Component Value Date/Time   CHOL 156 09/22/2014 0402   TRIG 80 09/22/2014 0402   HDL 48 09/22/2014 0402   CHOLHDL 3.3 09/22/2014 0402   VLDL 16 09/22/2014 0402   LDLCALC 92 09/22/2014 0402   Lab Results  Component Value Date   HGBA1C 6.0* 09/22/2014      Component Value Date/Time   LABOPIA NONE DETECTED 09/22/2014 0418   COCAINSCRNUR NONE DETECTED 09/22/2014 0418   LABBENZ NONE DETECTED 09/22/2014 0418   AMPHETMU NONE DETECTED 09/22/2014 0418   THCU NONE DETECTED 09/22/2014 0418   LABBARB NONE DETECTED 09/22/2014 0418     Recent Labs Lab 09/22/14 0401  ETH <11    Ct Head Wo Contrast 09/22/2014   No acute intracranial abnormalities. Mild atrophy and small vessel ischemic changes.    Mr Brain Wo Contrast 09/22/2014   Widespread areas of restricted diffusion are seen throughout the brain, most notable in the LEFT paramedian pons, consistent with multiple areas of acute infarction. No areas of hemorrhagic transformation. These lie within different vascular territories. A shower of emboli is not excluded.   Mr Maxine GlennMra Head/brain Wo Cm 09/22/2014  No proximal flow reducing lesion is evident on intracranial MRA.     Carotid Doppler  No evidence of hemodynamically significant internal carotid artery stenosis. Vertebral artery flow is antegrade.   2D Echocardiogram  EF 55-60% with no source of embolus. Mild concentric hypertrophy.   PHYSICAL EXAM Pleasant elderly Hispanic lady currently not in distress.Awake alert. Afebrile. Head is nontraumatic. Neck is supple without bruit. Hearing is normal. Cardiac exam no murmur or gallop. Lungs are clear to auscultation. Distal pulses are well felt. Neurological Exam ; exam limited due to requiring an interpreter Awake  Alert oriented x 3. Normal speech and language.eye  movements full without nystagmus.fundi were not visualized. Vision acuity and fields appear normal. Hearing is normal. Palatal movements are normal. Face symmetric. Tongue midline. Normal strength, tone, reflexes and coordination. Normal sensation. Gait deferred.   ASSESSMENT/PLAN [name] is a 80 y.o. female with history of diabetes mellitus and hyperlipidemia presenting with new onset slurred speech and right hemiparesis. She did receive IV t-PA 09/22/2014 at 0413. Initial CT imaging unremarkable.     Stroke:  suspect left brain infarct s/p IV tPA, infarct secondary to unknown source, workup pending. Significant improvement post tPA.  Worsening of right UE hemiparesis over night  MRI  Widespread areas of restricted diffusion are seen throughout the brain, most notable in the LEFT paramedian pons, consistent with multiple areas of acute infarction.  MRA  unremarkable  Carotid Doppler  No significant stenosis  2D Echo  No source of embolus TEE PFO (patent foramen ovale) likely an incidental finding.  LE venous dopplers for DVT as possible cause of stroke. pending   if dopplers negative, need LOOP placement  aspirin 81 mg orally every day prior to admission, now on clopidogrel 75 mg orally every day.   SCDs for VTE prophylaxis  Carb Control thin liquids  OOB.   Resultant aphasia has resolved, right hemiparesis arm has worsened  Therapy recommendations:  CIR  Ongoing aggressive risk factor management  Disposition:  Thought to be too high level for CIR, now with neuro worsening think she will qualify. no CIR beds available at this time. Will ask for SW to look for SNF placement vs [place] CIR vs Home with [facility] therapies  Hyperlipidemia  Home meds:  Lovaza, zocor  LDL 92  Continue statin  Diabetes  HgbA1c 6.0, at Goal < 7.0  Follow CBGs, cover w/ SSI  Other Stroke Risk Factors Advanced age  Other Active Problems  GERD on home prilosec  Baseline right  vision deficit  Hospital day # 3  SHARON BIBY, MSN, RN, ANVP-BC, ANP-BC, [facility] Pager: [phone] 09/25/2014 11:10 AM  I have personally examined this patient, reviewed notes, independently viewed imaging studies, participated in medical decision making and plan of care. I have made any additions or clarifications directly to the above note. Agree with note above. D/W daughter.  Delia Heady, MD Medical Director [facility] Pager: [phone] 09/25/2014 11:10 PM    To contact Stroke Continuity provider, please refer to [email or web address]. After hours, contact General Neurology

## 2014-09-25 NOTE — Progress Notes (Signed)
Inpatient Rehabilitation  I met with the patient and he daughter to discuss IP rehab recommendation of  HH/OP therapies.  Pt.'s daughter concerned that pt. Is weaker today and is requesting reconsideration of appropriateness of IP rehab.    Spoke with [place] PT and observed part of PT session.  Pt. today does not appear at min guard to min assist level.  Await PT note , however believe pt. Currently at mod to min assist level.  Will discuss with rehab  team and provide update as soon as possible. Daughter and Lorne Skeens updated. Please call if questions.  [facility] Admissions Coordinator Cell [phone] Office [phone]

## 2014-09-25 NOTE — Progress Notes (Signed)
*  Preliminary Results* Bilateral lower extremity venous duplex completed. Bilateral lower extremities are negative for deep vein thrombosis. There is no evidence of Baker's cyst bilaterally.  09/25/2014  Farhan Jean, RVT, RDCS, RDMS  

## 2014-09-26 LAB — GLUCOSE, CAPILLARY
GLUCOSE-CAPILLARY: 129 mg/dL — AB (ref 70–99)
Glucose-Capillary: 127 mg/dL — ABNORMAL HIGH (ref 70–99)
Glucose-Capillary: 106 mg/dL — ABNORMAL HIGH (ref 70–99)
Glucose-Capillary: 125 mg/dL — ABNORMAL HIGH (ref 70–99)

## 2014-09-26 NOTE — Progress Notes (Signed)
STROKE TEAM PROGRESS NOTE   HISTORY Jean Ward is an 80 y.o. female with a history of diabetes mellitus and hyperlipidemia presenting with acute slurred speech and right facial droop as well as weakness of right arm and right leg that started 12:00 AM on 09/22/2014. Patient's been taking aspirin daily. There is no history of stroke or TIA. CT scan of her head showed no acute intracranial abnormality. She was last seen well at midnight. Patient woke up at 2 AM with presenting deficits. NIH stroke score was 13. Patient was deemed a candidate for TPA administration which was started. She was admitted to the neuro ICU for further evaluation and treatment.   SUBJECTIVE (INTERVAL HISTORY) No family members present. The patient does not speak [place]. Communication is difficult. The patient appears to be in no acute distress. There have been reports of increased weakness noted by the patient's daughter and physical therapist. Dr. Riley Kill has reevaluated the patient today and now feels that she would be a candidate for inpatient rehabilitation.  OBJECTIVE Temp:  [98 F (36.7 C)-98.4 F (36.9 C)] 98.2 F (36.8 C) (10/08 0956) Pulse Rate:  [69-88] 79 (10/08 0956) Cardiac Rhythm:  [-] Normal sinus rhythm (10/08 0830) Resp:  [18-20] 20 (10/08 0956) BP: (120-138)/(39-58) 134/49 mmHg (10/08 0956) SpO2:  [92 %-97 %] 97 % (10/08 0956)   Recent Labs Lab 09/25/14 0642 09/25/14 1628 09/25/14 2115 09/26/14 0707 09/26/14 1153  GLUCAP 122* 117* 135* 127* 106*    Recent Labs Lab 09/22/14 0401 09/22/14 0408  NA 138 139  K 4.0 3.8  CL 101 103  CO2 26  --   GLUCOSE 148* 150*  BUN 18 18  CREATININE 0.74 0.80  CALCIUM 9.3  --     Recent Labs Lab 09/22/14 0401  AST 23  ALT 15  ALKPHOS 56  BILITOT 0.4  PROT 7.0  ALBUMIN 3.7    Recent Labs Lab 09/22/14 0401 09/22/14 0408  WBC 5.3  --   NEUTROABS 2.7  --   HGB 13.0 14.3  HCT 39.4 42.0  MCV 87.9  --   PLT 149*  --    No results  found for this basename: CKTOTAL, CKMB, CKMBINDEX, TROPONINI,  in the last 168 hours No results found for this basename: LABPROT, INR,  in the last 72 hours No results found for this basename: COLORURINE, APPERANCEUR, LABSPEC, PHURINE, GLUCOSEU, HGBUR, BILIRUBINUR, KETONESUR, PROTEINUR, UROBILINOGEN, NITRITE, LEUKOCYTESUR,  in the last 72 hours     Component Value Date/Time   CHOL 156 09/22/2014 0402   TRIG 80 09/22/2014 0402   HDL 48 09/22/2014 0402   CHOLHDL 3.3 09/22/2014 0402   VLDL 16 09/22/2014 0402   LDLCALC 92 09/22/2014 0402   Lab Results  Component Value Date   HGBA1C 6.0* 09/22/2014      Component Value Date/Time   LABOPIA NONE DETECTED 09/22/2014 0418   COCAINSCRNUR NONE DETECTED 09/22/2014 0418   LABBENZ NONE DETECTED 09/22/2014 0418   AMPHETMU NONE DETECTED 09/22/2014 0418   THCU NONE DETECTED 09/22/2014 0418   LABBARB NONE DETECTED 09/22/2014 0418     Recent Labs Lab 09/22/14 0401  ETH <11    Ct Head Wo Contrast 09/22/2014   No acute intracranial abnormalities. Mild atrophy and small vessel ischemic changes.    Mr Brain Wo Contrast 09/22/2014   Widespread areas of restricted diffusion are seen throughout the brain, most notable in the LEFT paramedian pons, consistent with multiple areas of acute infarction. No areas of hemorrhagic transformation.  These lie within different vascular territories. A shower of emboli is not excluded.   Mr Maxine GlennMra Head/brain Wo Cm 09/22/2014   No proximal flow reducing lesion is evident on intracranial MRA.     Carotid Doppler  No evidence of hemodynamically significant internal carotid artery stenosis. Vertebral artery flow is antegrade.   2D Echocardiogram  EF 55-60% with no source of embolus. Mild concentric hypertrophy.   PHYSICAL EXAM Pleasant elderly Hispanic lady currently not in distress.Awake alert. Afebrile. Head is nontraumatic. Neck is supple without bruit. Hearing is normal. Cardiac exam no murmur or gallop. Lungs are clear to  auscultation. Distal pulses are well felt. Neurological Exam ; exam limited due to requiring an interpreter Awake  Alert oriented x 3. Normal speech and language.eye movements full without nystagmus.fundi were not visualized. Vision acuity and fields appear normal. Hearing is normal. Palatal movements are normal. Face symmetric. Tongue midline. Normal strength, tone, reflexes and coordination. Normal sensation. Gait deferred.   ASSESSMENT/PLAN [name] is a 80 y.o. female with history of diabetes mellitus and hyperlipidemia presenting with new onset slurred speech and right hemiparesis. She did receive IV t-PA 09/22/2014 at 0413. Initial CT imaging unremarkable.     Stroke:  suspect left brain infarct s/p IV tPA, infarct secondary to unknown source, workup pending. Significant improvement post tPA.  Worsening of right UE hemiparesis over night  MRI  Widespread areas of restricted diffusion are seen throughout the brain, most notable in the LEFT paramedian pons, consistent with multiple areas of acute infarction.  MRA  unremarkable  Carotid Doppler  No significant stenosis  2D Echo  No source of embolus  TEE PFO (patent foramen ovale) likely an incidental finding. Severe non-mobile plaque in the descending thoracic aorta and distal portion of the aortic arch. LE venous dopplers for DVT as possible cause of stroke.  No evidence of DVT in lower extremities bilaterally.  If dopplers negative, need LOOP placement - spoke with cardiology and rehabilitation admission coordinator. Loop to be implanted tomorrow morning - admit to rehab tomorrow following Loop placement.  aspirin 81 mg orally every day prior to admission, now on clopidogrel 75 mg orally every day.   SCDs for VTE prophylaxis  Carb Control thin liquids  OOB.   Resultant aphasia has resolved, right hemiparesis arm has worsened  Therapy recommendations:  CIR - cleared for inpatient rehabilitation by Dr. Riley KillSwartz.    Ongoing aggressive risk factor management  Disposition:  Thought to be too high level for CIR, now with neuro worsening think she will qualify. no CIR beds available at this time. Will ask for SW to look for SNF placement vs [facility]  Social worker consult ordered but CIR now planned instead of SNF. Cancel consult.  Hyperlipidemia  Home meds:  Lovaza, zocor  LDL 92  Continue statin  Diabetes  HgbA1c 6.0, at Goal < 7.0  Follow CBGs, cover w/ SSI  Other Stroke Risk Factors Advanced age  Other Active Problems  GERD on home prilosec  Baseline right vision deficit  Hospital day # 4   Delton SeeDavid Rinehuls PA-C [facility] Pager [phone] 09/26/2014, 1:44 PM  I have personally examined this patient, reviewed notes, independently viewed imaging studies, participated in medical decision making and plan of care. I have made any additions or clarifications directly to the above note. Agree with note above. D/W daughter.   Delia HeadyPramod Aleese Kamps, MD  To contact Stroke Continuity provider, please refer to [email or web address].  After hours, contact General Neurology

## 2014-09-26 NOTE — Progress Notes (Signed)
I contacted pt's daughter by phone and she is to be here at 3 pm to discuss rehab needs and meet with therapy. [phone]
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
## 2014-09-26 NOTE — Consult Note (Signed)
ELECTROPHYSIOLOGY CONSULT NOTE  [name] MRN: [id number], DOB/AGE: 1933-11-05   Admit date: 09/22/2014 Date of Consult: 09/26/2014  Primary Physician: No primary provider on file. [facility] Reason for Consultation: Cryptogenic stroke; recommendations regarding Implantable Loop Recorder  History of Present Illness Jean Ward was admitted on 09/22/2014 with slurred speech and right facial droop.  Imaging demonstrated multiple areas of infarct most notable in the left paramedian pons.  She has undergone workup for stroke including echocardiogram and carotid dopplers.  The patient has been monitored on telemetry which has demonstrated sinus rhythm with no arrhythmias.  TEE demonstrated no cause for emboli, small PFO.  LE dopplers were negative for DVT.  Echocardiogram this admission demonstrated EF 60-65%, no RWMA, LA 33.  Lab work is reviewed.  Prior to admission, the patient denies chest pain, shortness of breath, dizziness, palpitations, or syncope.  They are recovering from their stroke with plans to participate in inpatient rehab at discharge.  EP has been asked to evaluate for placement of an implantable loop recorder to monitor for atrial fibrillation.  ROS is negative except as outlined above.    Past Medical History  Diagnosis Date  . Diabetes mellitus without complication   . Hypercholesteremia      Surgical History:  Past Surgical History  Procedure Laterality Date  . Hernia repair    . Tee without cardioversion N/A 09/24/2014    Procedure: TRANSESOPHAGEAL ECHOCARDIOGRAM (TEE);  Surgeon: Lars MassonKatarina H Nelson, MD;  Location: [facility] ENDOSCOPY;  Service: Cardiovascular;  Laterality: N/A;     Prescriptions prior to admission  Medication Sig Dispense Refill  . aspirin EC 81 MG tablet Take 81 mg by mouth daily.      Marland Kitchen. omega-3 acid ethyl esters (LOVAZA) 1 G capsule Take 1 g by mouth daily.      Marland Kitchen. omeprazole (PRILOSEC) 20 MG  capsule Take 40 mg by mouth daily.      . simvastatin (ZOCOR) 40 MG tablet Take 40 mg by mouth daily.      . sitaGLIPtin (JANUVIA) 100 MG tablet Take 100 mg by mouth daily.        Inpatient Medications:  . clopidogrel  75 mg Oral Daily  . insulin aspart  0-24 Units Subcutaneous TID AC & HS  . linagliptin  5 mg Oral Daily  . omega-3 acid ethyl esters  1 g Oral Daily  . pantoprazole  40 mg Oral QHS  . pneumococcal 23 valent vaccine  0.5 mL Intramuscular Tomorrow-1000  . simvastatin  40 mg Oral q1800    Allergies: No Known Allergies  History   Social History  . Marital Status: Unknown    Spouse Name: N/A    Number of Children: N/A  . Years of Education: N/A   Occupational History  . Not on file.   Social History Main Topics  . Smoking status: Never Smoker   . Smokeless tobacco: Not on file  . Alcohol Use: Not on file  . Drug Use: Not on file  . Sexual Activity: Not on file   Other Topics Concern  . Not on file   Social History Narrative  . No narrative on file     Family History - patient is unsure of family history  BP 128/41  Pulse 82  Temp(Src) 98.1 F (36.7 C) (Oral)  Resp 20  Ht 5' (1.524 m)  Wt 182 lb 1.6 oz (82.6 kg)  BMI 35.56 kg/m2  SpO2 97%  Physical  Exam: Physical Exam: Filed Vitals:   09/26/14 0956 09/26/14 1352 09/26/14 1746 09/26/14 2054  BP: 134/49 128/41 140/51 141/54  Pulse: 79 82 77 75  Temp: 98.2 F (36.8 C) 98.1 F (36.7 C) 98.3 F (36.8 C) 98 F (36.7 C)  TempSrc: Oral Oral Oral Oral  Resp: 20 20 20    Height:      Weight:      SpO2: 97% 97% 96% 94%    GEN- The patient is pleasant appearing, alert and oriented x 3 today.   Head- normocephalic, atraumatic Eyes-  Sclera clear, conjunctiva pink Ears- hearing intact Oropharynx- clear Neck- supple, Lungs- Clear to ausculation bilaterally, normal work of breathing Heart- Regular rate and rhythm, no murmurs, rubs or gallops, PMI not laterally displaced GI- soft, NT, ND, +  BS Extremities- no clubbing, cyanosis, or edema  MS- no significant deformity or atrophy Skin- no rash or lesion Psych- euthymic mood, full affect,  English is difficult.  She is happy to interact with me despite my limited spanish     Labs:   Lab Results  Component Value Date   WBC 5.3 09/22/2014   HGB 14.3 09/22/2014   HCT 42.0 09/22/2014   MCV 87.9 09/22/2014   PLT 149* 09/22/2014    Recent Labs Lab 09/22/14 0401 09/22/14 0408  NA 138 139  K 4.0 3.8  CL 101 103  CO2 26  --   BUN 18 18  CREATININE 0.74 0.80  CALCIUM 9.3  --   PROT 7.0  --   BILITOT 0.4  --   ALKPHOS 56  --   ALT 15  --   AST 23  --   GLUCOSE 148* 150*    Radiology/Studies: Ct Head Wo Contrast 09/23/2014   CLINICAL DATA:  Followup stroke.  24 hr status post tPA.  EXAM: CT HEAD WITHOUT CONTRAST  TECHNIQUE: Contiguous axial images were obtained from the base of the skull through the vertex without intravenous contrast.  COMPARISON:  Prior MRI from 09/22/2014  FINDINGS: Multiple previously identified small ischemic infarcts involving the left paramedian pons, right occipital lobe, temporal lobe, left frontal and left temporal lobes are not well visualized. No evidence of hemorrhagic transformation status post tPA. No definite new large vessel territory infarct.  No midline shift. No hydrocephalus. No extra-axial fluid collection.  Calvarium intact.  Scalp soft tissues unremarkable.  No acute abnormality seen about either orbit.  Paranasal sinuses and mastoid air cells are clear.  IMPRESSION: 1. Poor visualization of previously identified small multi focal acute ischemic infarcts involving the brain as seen on prior MRI from 09/22/2014. No evidence of hemorrhagic transformation or other bleed status post tPA. 2. No new acute intracranial process.   Electronically Signed   By: Rise Mu M.D.   On: 09/23/2014 05:45    Mr Brain Wo Contrast 09/22/2014   CLINICAL DATA:  RIGHT-sided weakness and dysarthria which  began at midnight 09/22/2014. Stroke risk factors include diabetes and hypertension. TPA was administered  EXAM: MRI HEAD WITHOUT CONTRAST  MRA HEAD WITHOUT CONTRAST  TECHNIQUE: Multiplanar, multiecho pulse sequences of the brain and surrounding structures were obtained without intravenous contrast. Angiographic images of the head were obtained using MRA technique without contrast.  COMPARISON:  CT head earlier in the day  FINDINGS: MRI HEAD FINDINGS  Widespread areas of restricted diffusion are seen throughout the brain consistent with multiple areas of acute infarction. These include the LEFT paramedian pons, RIGHT occipital lobe, RIGHT posterior temporal lobe, LEFT posterior  frontal cortex, and LEFT posterior temporal lobe. There are no areas of hemorrhage, mass lesion, hydrocephalus, or extra-axial fluid. Mild cerebral and cerebellar atrophy is present. Mild subcortical and periventricular T2 and FLAIR hyperintensities, likely chronic microvascular ischemic change. Normal pituitary and cerebellar tonsils. Mild pannus. Mild cervical spondylosis. Flow voids are maintained. BILATERAL cataract extraction. No sinus or mastoid disease. Compared with prior CT, the infarcts are not visible.  MRA HEAD FINDINGS  The internal carotid arteries are widely patent. The basilar artery is widely patent with the LEFT vertebral the dominant/sole contributor. RIGHT vertebral contributes to PICA. There is no visible intracranial stenosis or berry aneurysm. No distal MCA and PCA irregularity. 2 mm medial wide necked outpouching from the superior cavernous LEFT ICA consistent with atheromatous disease.  IMPRESSION: Widespread areas of restricted diffusion are seen throughout the brain, most notable in the LEFT paramedian pons, consistent with multiple areas of acute infarction. No areas of hemorrhagic transformation. These lie within different vascular territories. A shower of emboli is not excluded. See discussion above.  No proximal  flow reducing lesion is evident on intracranial MRA.   Electronically Signed   By: Davonna BellingJohn  Curnes M.D.   On: 09/22/2014 16:46   12-lead ECG sinus rhythm  Telemetry sinus rhythm  TEE is reviewed and reveals no thrombus.  PFO was noted BLE dopplers are reviewed and reveal no DVT.  Assessment and Plan:  1. Cryptogenic stroke The patient presents with cryptogenic stroke.  TEE and dopplers are reviewed.  I spoke at length with the patient about monitoring for afib with an implantable loop recorder.  Risks, benefits, and alteratives to implantable loop recorder were discussed with the patient today.   At this time, the patient is very clear in their decision to proceed with implantable loop recorder.  I will therefore plan to proceed with ILR placement first thing tomorrow morning.  Please call with questions.

## 2014-09-26 NOTE — Progress Notes (Signed)
I met with pt, her daughter, and OT at 3 pm to discuss the possibility of an inpt rehab admission. All are in agreement to admission. I contacted Mikey Bussing PA that there seems to be the need for a Loop placement per previous Stroke documentation. He is to arrange Loop placement and then admission to inpt rehab after placed. I will follow up in the morning. [phone]

## 2014-09-26 NOTE — Consult Note (Signed)
[place] PHYSICAL MEDICINE AND REHABILITATION  CONSULT SERVICE NOTE  Given the patient's increased weakness and functional decline, i now believe she could benefit from an inpatient rehab admission.  7 day elos  Ranelle OysterZachary T. Swartz, MD, [facility] [place] [facility] Medicine & Rehabilitation 09/26/2014

## 2014-09-26 NOTE — Progress Notes (Signed)
Occupational Therapy Treatment Patient Details Name: Jean Ward MRN: [id number] DOB: March 08, 1933 Today's Date: 09/26/2014    History of present illness 80 y.o. female with a history of GERD, diabetes mellitus and hyperlipidemia presenting with acute slurred speech and right facial droop as well as weakness of right arm and right leg. Initial head CT showed no acute bleed. MD suspects left brain infarct. TPA administered. NIH score 13. Neuro workup underway.   OT comments  Pt demonstrates decr pre gait ability to static stand and step. Pt with LLE weakness. Pt reports dizziness with static standing. Pt unable to transfer to sink level as planned for session. Pt returned to supine at end of session pending CIR admission. OT to follow acutely for adl retraining and static standing balance.    Follow Up Recommendations  CIR;Supervision/Assistance - 24 hour    Equipment Recommendations  3 in 1 bedside comode;Tub/shower bench    Recommendations for Other Services Rehab consult    Precautions / Restrictions Precautions Precautions: Fall       Mobility Bed Mobility Overal bed mobility: Needs Assistance Bed Mobility: Supine to Sit     Supine to sit: Max assist Sit to supine: Max assist   General bed mobility comments: requries (A) to push with R ue into sitting  Transfers                      Balance Overall balance assessment: Needs assistance Sitting-balance support: Bilateral upper extremity supported;Feet supported Sitting balance-Leahy Scale: Poor   Postural control: Posterior lean Standing balance support: Bilateral upper extremity supported;During functional activity Standing balance-Leahy Scale: Poor Standing balance comment: requires BIL UE support and posterior lean                   ADL Overall ADL's : Needs assistance/impaired                                     Functional mobility during ADLs: Maximal assistance General ADL  Comments: Pt requires (A) to exit bed. Pt unable to come from supine <>Sit eob on the right side. Pt asking for help stating she is unable to continue. pt noted to have large L bruise on L ue. Pt static sitting with R lateral lean and decr R core muscle activation. pt sit<>Stand max (A). pt with decr balance and posterior lean. pt demonstrates ankle strategies attemptign to remain standing. pt asked to march in place. pt lifting R LE and demonstrates inabiliyt to lift L LE. pt buckling. pt with poor pregait demo. Pt asked to side step to the bottom of bed and unable to complete but 1 step Pt liable at eob due to frustration with new changes. Pt describes a clear medial divide of body sensation R vs L. pt states "it does not feel like its my body"      Vision                     Perception     Praxis      Cognition   Behavior During Therapy: WFL for tasks assessed/performed Overall Cognitive Status: Within Functional Limits for tasks assessed                       Extremity/Trunk Assessment               Exercises  Shoulder Instructions       General Comments      Pertinent Vitals/ Pain       Pain Assessment: 0-10 Pain Score: 5  Pain Location: back pain Pain Intervention(s): Repositioned  Home Living                                          Prior Functioning/Environment              Frequency Min 3X/week     Progress Toward Goals  OT Goals(current goals can now be found in the care plan section)  Progress towards OT goals: Progressing toward goals  Acute Rehab OT Goals Patient Stated Goal: none stated OT Goal Formulation: With patient/family Time For Goal Achievement: 10/07/14 Potential to Achieve Goals: Good ADL Goals Pt Will Perform Lower Body Bathing: with modified independence;sit to/from stand Pt Will Perform Lower Body Dressing: with modified independence;sit to/from stand Pt Will Transfer to Toilet: with  modified independence;ambulating;regular height toilet Pt Will Perform Toileting - Clothing Manipulation and hygiene: with modified independence;sit to/from stand Pt/caregiver will Perform Home Exercise Program: Increased strength;With theraputty;Right Upper extremity;With written HEP provided  Plan Discharge plan remains appropriate    Co-evaluation                 End of Session Equipment Utilized During Treatment: Gait belt   Activity Tolerance Patient tolerated treatment well   Patient Left in bed;with call bell/phone within reach;with family/visitor present   Nurse Communication Mobility status;Precautions        Time: 1500-1536 OT Time Calculation (min): 36 min  Charges: OT General Charges $OT Visit: 1 Procedure OT Treatments $Self Care/Home Management : 23-37 mins  Jean Ward 09/26/2014, 3:46 PM Pager: [phone]

## 2014-09-27 ENCOUNTER — Encounter (HOSPITAL_COMMUNITY)
Admission: EM | Disposition: A | Discharge status: Rehabilitation Facility | Payer: Self-pay | Source: Home / Self Care | Attending: Neurology

## 2014-09-27 ENCOUNTER — Inpatient Hospital Stay (HOSPITAL_COMMUNITY): Payer: Medicare Other

## 2014-09-27 ENCOUNTER — Inpatient Hospital Stay (HOSPITAL_COMMUNITY): Payer: Medicare Other | Admitting: Occupational Therapy

## 2014-09-27 ENCOUNTER — Encounter (HOSPITAL_COMMUNITY): Payer: Self-pay | Admitting: Physical Medicine and Rehabilitation

## 2014-09-27 ENCOUNTER — Inpatient Hospital Stay (HOSPITAL_COMMUNITY)
Admission: RE | Admit: 2014-09-27 | Discharge: 2014-10-10 | DRG: 057 | Disposition: A | Discharge status: Home Health | Payer: Medicare Other | Source: Intra-hospital | Attending: Physical Medicine & Rehabilitation | Admitting: Physical Medicine & Rehabilitation

## 2014-09-27 ENCOUNTER — Encounter (HOSPITAL_COMMUNITY): Payer: Self-pay | Admitting: Rehabilitation

## 2014-09-27 DIAGNOSIS — I6340 Cerebral infarction due to embolism of unspecified cerebral artery: Secondary | ICD-10-CM | POA: Diagnosis not present

## 2014-09-27 DIAGNOSIS — M549 Dorsalgia, unspecified: Secondary | ICD-10-CM | POA: Diagnosis present

## 2014-09-27 DIAGNOSIS — S7002XD Contusion of left hip, subsequent encounter: Secondary | ICD-10-CM | POA: Diagnosis not present

## 2014-09-27 DIAGNOSIS — E780 Pure hypercholesterolemia: Secondary | ICD-10-CM | POA: Diagnosis present

## 2014-09-27 DIAGNOSIS — G8929 Other chronic pain: Secondary | ICD-10-CM | POA: Diagnosis present

## 2014-09-27 DIAGNOSIS — I6350 Cerebral infarction due to unspecified occlusion or stenosis of unspecified cerebral artery: Secondary | ICD-10-CM

## 2014-09-27 DIAGNOSIS — E785 Hyperlipidemia, unspecified: Secondary | ICD-10-CM | POA: Diagnosis present

## 2014-09-27 DIAGNOSIS — E119 Type 2 diabetes mellitus without complications: Secondary | ICD-10-CM | POA: Diagnosis present

## 2014-09-27 DIAGNOSIS — I69328 Other speech and language deficits following cerebral infarction: Secondary | ICD-10-CM

## 2014-09-27 DIAGNOSIS — I69351 Hemiplegia and hemiparesis following cerebral infarction affecting right dominant side: Principal | ICD-10-CM

## 2014-09-27 DIAGNOSIS — X58XXXD Exposure to other specified factors, subsequent encounter: Secondary | ICD-10-CM | POA: Diagnosis present

## 2014-09-27 DIAGNOSIS — M25562 Pain in left knee: Secondary | ICD-10-CM | POA: Diagnosis not present

## 2014-09-27 DIAGNOSIS — M542 Cervicalgia: Secondary | ICD-10-CM | POA: Diagnosis not present

## 2014-09-27 DIAGNOSIS — I63432 Cerebral infarction due to embolism of left posterior cerebral artery: Secondary | ICD-10-CM

## 2014-09-27 DIAGNOSIS — I69322 Dysarthria following cerebral infarction: Secondary | ICD-10-CM

## 2014-09-27 DIAGNOSIS — B9620 Unspecified Escherichia coli [E. coli] as the cause of diseases classified elsewhere: Secondary | ICD-10-CM | POA: Diagnosis not present

## 2014-09-27 DIAGNOSIS — M25569 Pain in unspecified knee: Secondary | ICD-10-CM

## 2014-09-27 DIAGNOSIS — T148XXA Other injury of unspecified body region, initial encounter: Secondary | ICD-10-CM

## 2014-09-27 DIAGNOSIS — I69392 Facial weakness following cerebral infarction: Secondary | ICD-10-CM | POA: Diagnosis not present

## 2014-09-27 DIAGNOSIS — I639 Cerebral infarction, unspecified: Secondary | ICD-10-CM | POA: Diagnosis present

## 2014-09-27 DIAGNOSIS — N390 Urinary tract infection, site not specified: Secondary | ICD-10-CM | POA: Diagnosis not present

## 2014-09-27 HISTORY — DX: Osteoarthritis of knee, unspecified: M17.9

## 2014-09-27 HISTORY — PX: LOOP RECORDER IMPLANT: SHX5477

## 2014-09-27 HISTORY — DX: Patent foramen ovale: Q21.12

## 2014-09-27 HISTORY — DX: Low back pain, unspecified: M54.50

## 2014-09-27 HISTORY — DX: Unilateral primary osteoarthritis, unspecified knee: M17.10

## 2014-09-27 HISTORY — DX: Atrial septal defect: Q21.1

## 2014-09-27 HISTORY — DX: Low back pain: M54.5

## 2014-09-27 HISTORY — DX: Aneurysm of heart: I25.3

## 2014-09-27 LAB — GLUCOSE, CAPILLARY
GLUCOSE-CAPILLARY: 125 mg/dL — AB (ref 70–99)
Glucose-Capillary: 112 mg/dL — ABNORMAL HIGH (ref 70–99)
Glucose-Capillary: 110 mg/dL — ABNORMAL HIGH (ref 70–99)
Glucose-Capillary: 111 mg/dL — ABNORMAL HIGH (ref 70–99)
Glucose-Capillary: 139 mg/dL — ABNORMAL HIGH (ref 70–99)

## 2014-09-27 SURGERY — LOOP RECORDER IMPLANT
Anesthesia: LOCAL

## 2014-09-27 MED ORDER — LINAGLIPTIN 5 MG PO TABS
5.0000 mg | ORAL_TABLET | Freq: Every day | ORAL | Status: DC
  Administered 2014-09-28 – 2014-10-10 (×13): 5 mg via ORAL
  Filled 2014-09-27 (×14): qty 1

## 2014-09-27 MED ORDER — BISACODYL 10 MG RE SUPP: 10.0000 mg | Freq: Every day | RECTAL | Status: DC | PRN

## 2014-09-27 MED ORDER — SIMVASTATIN 40 MG PO TABS
40.0000 mg | ORAL_TABLET | Freq: Every day | ORAL | Status: DC
  Administered 2014-09-28 – 2014-10-09 (×11): 40 mg via ORAL
  Filled 2014-09-27 (×13): qty 1

## 2014-09-27 MED ORDER — ACETAMINOPHEN 325 MG PO TABS
325.0000 mg | ORAL_TABLET | ORAL | Status: DC | PRN
  Administered 2014-10-02: 325 mg via ORAL
  Filled 2014-09-27: qty 1

## 2014-09-27 MED ORDER — CLOPIDOGREL BISULFATE 75 MG PO TABS
75.0000 mg | ORAL_TABLET | Freq: Every day | ORAL | Status: DC
  Administered 2014-09-28 – 2014-10-10 (×13): 75 mg via ORAL
  Filled 2014-09-27 (×14): qty 1

## 2014-09-27 MED ORDER — DIPHENHYDRAMINE HCL 12.5 MG/5ML PO ELIX: 12.5000 mg | ORAL_SOLUTION | Freq: Four times a day (QID) | ORAL | Status: DC | PRN

## 2014-09-27 MED ORDER — TRAMADOL HCL 50 MG PO TABS
50.0000 mg | ORAL_TABLET | Freq: Four times a day (QID) | ORAL | Status: DC | PRN
  Administered 2014-09-27 – 2014-09-29 (×2): 50 mg via ORAL
  Filled 2014-09-27 (×2): qty 1

## 2014-09-27 MED ORDER — OMEGA-3-ACID ETHYL ESTERS 1 G PO CAPS
1.0000 g | ORAL_CAPSULE | Freq: Every day | ORAL | Status: DC
  Administered 2014-09-28 – 2014-10-10 (×13): 1 g via ORAL
  Filled 2014-09-27 (×14): qty 1

## 2014-09-27 MED ORDER — ALUM & MAG HYDROXIDE-SIMETH 200-200-20 MG/5ML PO SUSP: 30.0000 mL | ORAL | Status: DC | PRN

## 2014-09-27 MED ORDER — INSULIN ASPART 100 UNIT/ML ~~LOC~~ SOLN
0.0000 [IU] | Freq: Three times a day (TID) | SUBCUTANEOUS | Status: DC
Start: 2014-09-28 — End: 2014-10-10
  Administered 2014-09-28 (×2): 1 [IU] via SUBCUTANEOUS
  Administered 2014-09-28: 2 [IU] via SUBCUTANEOUS
  Administered 2014-09-29 – 2014-10-07 (×6): 1 [IU] via SUBCUTANEOUS

## 2014-09-27 MED ORDER — INSULIN ASPART 100 UNIT/ML ~~LOC~~ SOLN: 0.0000 [IU] | Freq: Every day | SUBCUTANEOUS | Status: DC

## 2014-09-27 MED ORDER — TRAZODONE HCL 50 MG PO TABS
25.0000 mg | ORAL_TABLET | Freq: Every evening | ORAL | Status: DC | PRN
  Administered 2014-09-27: 50 mg via ORAL
  Filled 2014-09-27: qty 1

## 2014-09-27 MED ORDER — PROCHLORPERAZINE EDISYLATE 5 MG/ML IJ SOLN
5.0000 mg | Freq: Four times a day (QID) | INTRAMUSCULAR | Status: DC | PRN
  Filled 2014-09-27: qty 2

## 2014-09-27 MED ORDER — PANTOPRAZOLE SODIUM 40 MG PO TBEC
40.0000 mg | DELAYED_RELEASE_TABLET | Freq: Every day | ORAL | Status: DC
  Administered 2014-09-27 – 2014-10-09 (×13): 40 mg via ORAL
  Filled 2014-09-27 (×14): qty 1

## 2014-09-27 MED ORDER — PROCHLORPERAZINE MALEATE 5 MG PO TABS
5.0000 mg | ORAL_TABLET | Freq: Four times a day (QID) | ORAL | Status: DC | PRN
  Filled 2014-09-27: qty 2

## 2014-09-27 MED ORDER — GUAIFENESIN-DM 100-10 MG/5ML PO SYRP: 5.0000 mL | ORAL_SOLUTION | Freq: Four times a day (QID) | ORAL | Status: DC | PRN

## 2014-09-27 MED ORDER — ENOXAPARIN SODIUM 40 MG/0.4ML ~~LOC~~ SOLN
40.0000 mg | Freq: Every day | SUBCUTANEOUS | Status: DC
  Administered 2014-09-27 – 2014-10-09 (×13): 40 mg via SUBCUTANEOUS
  Filled 2014-09-27 (×14): qty 0.4

## 2014-09-27 MED ORDER — DICLOFENAC SODIUM 1 % TD GEL
2.0000 g | Freq: Three times a day (TID) | TRANSDERMAL | Status: DC
  Administered 2014-09-28 – 2014-10-10 (×35): 2 g via TOPICAL
  Filled 2014-09-27: qty 100

## 2014-09-27 MED ORDER — PROCHLORPERAZINE 25 MG RE SUPP
12.5000 mg | Freq: Four times a day (QID) | RECTAL | Status: DC | PRN
  Filled 2014-09-27: qty 1

## 2014-09-27 MED ORDER — FLEET ENEMA 7-19 GM/118ML RE ENEM: 1.0000 | ENEMA | Freq: Once | RECTAL | Status: AC | PRN

## 2014-09-27 MED ORDER — LIDOCAINE-EPINEPHRINE 1 %-1:100000 IJ SOLN
INTRAMUSCULAR | Status: AC
  Filled 2014-09-27: qty 1

## 2014-09-27 NOTE — Clinical Social Work Psychosocial (Signed)
Clinical Social Work Department BRIEF PSYCHOSOCIAL ASSESSMENT 09/27/2014  Patient:  Jean Ward,Jean Ward     Account Number:  [id number]     Admit date:  09/22/2014  Clinical Social Worker:  Derenda FennelNIXON,Jc Veron, CLINICAL SOCIAL WORKER  Date/Time:  09/27/2014 02:31 PM  Referred by:  Physician  Date Referred:  09/27/2014 Referred for  SNF Placement   Other Referral:   Interview type:  Other - See comment Other interview type:   CSW spoke with pt's daughter sitting at bedside.    PSYCHOSOCIAL DATA Living Status:  FAMILY Admitted from facility:   Level of care:   Primary support name:  Jean Ward [phone] Primary support relationship to patient:  CHILD, ADULT Degree of support available:   Strong    CURRENT CONCERNS  Other Concerns:    SOCIAL WORK ASSESSMENT / PLAN CSW spoke with pt and pt's daughter at bedside [facility] for [facility]. CSW provided SNF list and explained SNF process as a backup for CIR. CSW continues to follow pt and provide continued support.   Assessment/plan status:  Psychosocial Support/Ongoing Assessment of Needs Other assessment/ plan:   Information/referral to community resources:   CSW provided SNF list and explained SNF process.    PATIENT'S/FAMILY'S RESPONSE TO PLAN OF CARE: Pt lying in bed, alert/oriented and smiling. Pt and pt's daughter agreeable to CIR backup plan for SNF placement.       Derenda FennelBashira Tobie Hellen, MSW, LCSWA [phone] 09/27/2014 2:45 PM

## 2014-09-27 NOTE — Progress Notes (Signed)
Some bleeding and hematoma after loop recorder placement earlier today. Dressing took off after compression. Bleeding stopped. On plavix. Continue to observe.  Ramond DialSigned, Desirie Minteer PA Pager: [phone]

## 2014-09-27 NOTE — Progress Notes (Signed)
STROKE TEAM PROGRESS NOTE   HISTORY Jean Ward is an 80 y.o. female with a history of diabetes mellitus and hyperlipidemia presenting with acute slurred speech and right facial droop as well as weakness of right arm and right leg that started 12:00 AM on 09/22/2014. Patient's been taking aspirin daily. There is no history of stroke or TIA. CT scan of her head showed no acute intracranial abnormality. She was last seen well at midnight. Patient woke up at 2 AM with presenting deficits. NIH stroke score was 13. Patient was deemed a candidate for TPA administration which was started. She was admitted to the neuro ICU for further evaluation and treatment.   SUBJECTIVE (INTERVAL HISTORY) No family members present. The patient does not speak [place]. Communication is difficult. The patient appears to be in no acute distress. I spoke to the patient's daughter who for and explained results of pelvic and hip x-rays show no fracture. Patient will be transferred today to inpatient rehabilitation.  OBJECTIVE Temp:  [98 F (36.7 C)-99.3 F (37.4 C)] 98.2 F (36.8 C) (10/09 1342) Pulse Rate:  [68-77] 73 (10/09 1342) Cardiac Rhythm:  [-] Normal sinus rhythm (10/08 2100) Resp:  [18-20] 20 (10/09 1342) BP: (126-143)/(51-61) 127/61 mmHg (10/09 1342) SpO2:  [94 %-98 %] 94 % (10/09 1342)   Recent Labs Lab 09/26/14 1614 09/26/14 2149 09/27/14 0648 09/27/14 0753 09/27/14 1217  GLUCAP 125* 129* 112* 110* 111*    Recent Labs Lab 09/22/14 0401 09/22/14 0408  NA 138 139  K 4.0 3.8  CL 101 103  CO2 26  --   GLUCOSE 148* 150*  BUN 18 18  CREATININE 0.74 0.80  CALCIUM 9.3  --     Recent Labs Lab 09/22/14 0401  AST 23  ALT 15  ALKPHOS 56  BILITOT 0.4  PROT 7.0  ALBUMIN 3.7    Recent Labs Lab 09/22/14 0401 09/22/14 0408  WBC 5.3  --   NEUTROABS 2.7  --   HGB 13.0 14.3  HCT 39.4 42.0  MCV 87.9  --   PLT 149*  --    No results found for this basename: CKTOTAL, CKMB, CKMBINDEX,  TROPONINI,  in the last 168 hours No results found for this basename: LABPROT, INR,  in the last 72 hours No results found for this basename: COLORURINE, APPERANCEUR, LABSPEC, PHURINE, GLUCOSEU, HGBUR, BILIRUBINUR, KETONESUR, PROTEINUR, UROBILINOGEN, NITRITE, LEUKOCYTESUR,  in the last 72 hours     Component Value Date/Time   CHOL 156 09/22/2014 0402   TRIG 80 09/22/2014 0402   HDL 48 09/22/2014 0402   CHOLHDL 3.3 09/22/2014 0402   VLDL 16 09/22/2014 0402   LDLCALC 92 09/22/2014 0402   Lab Results  Component Value Date   HGBA1C 6.0* 09/22/2014      Component Value Date/Time   LABOPIA NONE DETECTED 09/22/2014 0418   COCAINSCRNUR NONE DETECTED 09/22/2014 0418   LABBENZ NONE DETECTED 09/22/2014 0418   AMPHETMU NONE DETECTED 09/22/2014 0418   THCU NONE DETECTED 09/22/2014 0418   LABBARB NONE DETECTED 09/22/2014 0418     Recent Labs Lab 09/22/14 0401  ETH <11    Ct Head Wo Contrast 09/22/2014   No acute intracranial abnormalities. Mild atrophy and small vessel ischemic changes.    Mr Brain Wo Contrast 09/22/2014   Widespread areas of restricted diffusion are seen throughout the brain, most notable in the LEFT paramedian pons, consistent with multiple areas of acute infarction. No areas of hemorrhagic transformation. These lie within different vascular territories. A  shower of emboli is not excluded.   Mr Maxine GlennMra Head/brain Wo Cm 09/22/2014   No proximal flow reducing lesion is evident on intracranial MRA.     Carotid Doppler  No evidence of hemodynamically significant internal carotid artery stenosis. Vertebral artery flow is antegrade.   2D Echocardiogram  EF 55-60% with no source of embolus. Mild concentric hypertrophy.   PHYSICAL EXAM Pleasant elderly Hispanic lady currently not in distress.Awake alert. Afebrile. Head is nontraumatic. Neck is supple without bruit. Hearing is normal. Cardiac exam no murmur or gallop. Lungs are clear to auscultation. Distal pulses are well felt. Neurological  Exam ; exam limited due to requiring an interpreter Awake  Alert . Normal speech and language.eye movements full without nystagmus.fundi were not visualized. Vision acuity and fields appear normal. Hearing is normal. Palatal movements are normal. Face symmetric. Tongue midline. Normal strength, tone, reflexes and coordination. Normal sensation. Gait deferred.   ASSESSMENT/PLAN [name] is a 80 y.o. female with history of diabetes mellitus and hyperlipidemia presenting with new onset slurred speech and right hemiparesis. She did receive IV t-PA 09/22/2014 at 0413. Initial CT imaging unremarkable.     Stroke:  suspect left brain infarct s/p IV tPA, infarct secondary to unknown source, workup pending. Significant improvement post tPA.  Worsening of right UE hemiparesis over night  MRI  Widespread areas of restricted diffusion are seen throughout the brain, most notable in the LEFT paramedian pons, consistent with multiple areas of acute infarction.  MRA  unremarkable  Carotid Doppler  No significant stenosis  2D Echo  No source of embolus  TEE PFO (patent foramen ovale) likely an incidental finding. Severe non-mobile plaque in the descending thoracic aorta and distal portion of the aortic arch. LE venous dopplers for DVT as possible cause of stroke.  No evidence of DVT in lower extremities bilaterally.  If dopplers negative, need LOOP placement - spoke with cardiology and rehabilitation admission coordinator. Loop to be implanted tomorrow morning - admit to rehab tomorrow following Loop placement.  aspirin 81 mg orally every day prior to admission, now on clopidogrel 75 mg orally every day.   SCDs for VTE prophylaxis  Carb Control thin liquids  OOB.   Resultant aphasia has resolved, right hemiparesis arm has worsened  Therapy recommendations:  CIR - cleared for inpatient rehabilitation by Dr. Riley KillSwartz.   Ongoing aggressive risk factor management  Disposition:  Thought to be  too high level for CIR, now with neuro worsening think she will qualify. no CIR beds available at this time. Will ask for SW to look for SNF placement vs [place] [facility]  Social worker consult ordered but CIR now planned instead of SNF. Cancel consult.  Hyperlipidemia  Home meds:  Lovaza, zocor  LDL 92  Continue statin  Diabetes  HgbA1c 6.0, at Goal < 7.0  Follow CBGs, cover w/ SSI  Other Stroke Risk Factors Advanced age  Other Active Problems  GERD on home prilosec  Baseline right vision deficit  Hospital day # 5   Delton SeeDavid Rinehuls PA-C [facility] Pager [phone] 09/27/2014, 3:21 PM  I have personally examined this patient, reviewed notes, independently viewed imaging studies, participated in medical decision making and plan of care. I have made any additions or clarifications directly to the above note. Agree with note above. D/W daughter. Transfer to inpatient rehabilitation today. Followup as an outpatient with me in 4 weeks.  Delia HeadyPramod Garv Kuechle, MD  To contact Stroke Continuity provider,  please refer to [email or web address]. After hours, contact General Neurology

## 2014-09-27 NOTE — Interval H&P Note (Signed)
Jean Ward was admitted today to Inpatient Rehabilitation with the diagnosis of embolic left pontine infarct.  The patient's history has been reviewed, patient examined, and there is no change in status.  Patient continues to be appropriate for intensive inpatient rehabilitation.  I have reviewed the patient's chart and labs.  Questions were answered to the patient's satisfaction.  Mirakle Tomlin T 09/27/2014, 7:46 PM
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
## 2014-09-27 NOTE — Progress Notes (Signed)
Pt just returned from Loop placement. Interpreter at bedside. I discussed with Marissa NestlePam Love, PA the fall on 10/06 am and pt complaints of pain l hip and decrease in functional mobility. I await follow up before planning admission to inpt rehab today. RN to contact me when daughter arrives this morning. [phone]

## 2014-09-27 NOTE — Progress Notes (Signed)
Patient arrived in the unit around 7:15 pm from [address] [place] assisted by RN and family members. Patient is stable. Vital signs taken and recorded. Orientation to room and rehab unit  given to patient and family members  verbalizes understanding. Patient speaks Spanish.No active bleeding noted  from the loop recorder. Dressing dry and intact.  Call bell within reach. Will continue to monitor.

## 2014-09-27 NOTE — Discharge Summary (Signed)
Stroke Discharge Summary  [name]    l   MRN: [id number]      DOB: 1933-07-30  Date of Admission: 09/22/2014 Date of Discharge: 09/27/2014  Attending Physician:  Delia HeadyPramod Sethi, MD, Stroke MD  Consulting Physician(s):    cardiology and rehabilitation medicine  Patient's PCP:  No primary provider on file.  Discharge Diagnoses: Multiple acute strokes. Active Problems:   CVA (cerebral infarction)   Diabetes mellitus type 2, controlled, without complications BMI  Body mass index is 35.56 kg/(m^2).  Past Medical History  Diagnosis Date  . Diabetes mellitus without complication   . Hypercholesteremia    Past Surgical History  Procedure Laterality Date  . Hiatal hernia repair    . Tee without cardioversion N/A 09/24/2014    Procedure: TRANSESOPHAGEAL ECHOCARDIOGRAM (TEE);  Surgeon: Lars MassonKatarina H Nelson, MD;  Location[facility] ENDOSCOPY;  Service: Cardiovascular;  Laterality: N/A;    Medications to be continued on Rehab . clopidogrel  75 mg Oral Daily  . insulin aspart  0-24 Units Subcutaneous TID AC & HS  . linagliptin  5 mg Oral Daily  . omega-3 acid ethyl esters  1 g Oral Daily  . pantoprazole  40 mg Oral QHS  . simvastatin  40 mg Oral q1800    LABORATORY STUDIES CBC    Component Value Date/Time   WBC 5.3 09/22/2014 0401   RBC 4.48 09/22/2014 0401   HGB 14.3 09/22/2014 0408   HCT 42.0 09/22/2014 0408   PLT 149* 09/22/2014 0401   MCV 87.9 09/22/2014 0401   MCH 29.0 09/22/2014 0401   MCHC 33.0 09/22/2014 0401   RDW 13.0 09/22/2014 0401   LYMPHSABS 2.0 09/22/2014 0401   MONOABS 0.5 09/22/2014 0401   EOSABS 0.1 09/22/2014 0401   BASOSABS 0.0 09/22/2014 0401   CMP    Component Value Date/Time   NA 139 09/22/2014 0408   K 3.8 09/22/2014 0408   CL 103 09/22/2014 0408   CO2 26 09/22/2014 0401   GLUCOSE 150* 09/22/2014 0408   BUN 18 09/22/2014 0408   CREATININE 0.80 09/22/2014 0408   CALCIUM 9.3 09/22/2014 0401   PROT 7.0 09/22/2014 0401   ALBUMIN 3.7 09/22/2014 0401   AST 23  09/22/2014 0401   ALT 15 09/22/2014 0401   ALKPHOS 56 09/22/2014 0401   BILITOT 0.4 09/22/2014 0401   GFRNONAA 79* 09/22/2014 0401   GFRAA >90 09/22/2014 0401   COAGS Lab Results  Component Value Date   INR 1.00 09/22/2014   Lipid Panel    Component Value Date/Time   CHOL 156 09/22/2014 0402   TRIG 80 09/22/2014 0402   HDL 48 09/22/2014 0402   CHOLHDL 3.3 09/22/2014 0402   VLDL 16 09/22/2014 0402   LDLCALC 92 09/22/2014 0402   HgbA1C  Lab Results  Component Value Date   HGBA1C 6.0* 09/22/2014   Cardiac Panel (last 3 results) No results found for this basename: CKTOTAL, CKMB, TROPONINI, RELINDX,  in the last 72 hours Urinalysis    Component Value Date/Time   COLORURINE YELLOW 09/22/2014 0418   APPEARANCEUR CLEAR 09/22/2014 0418   LABSPEC 1.011 09/22/2014 0418   PHURINE 5.5 09/22/2014 0418   GLUCOSEU 250* 09/22/2014 0418   HGBUR TRACE* 09/22/2014 0418   BILIRUBINUR NEGATIVE 09/22/2014 0418   KETONESUR NEGATIVE 09/22/2014 0418   PROTEINUR NEGATIVE 09/22/2014 0418   UROBILINOGEN 0.2 09/22/2014 0418   NITRITE NEGATIVE 09/22/2014 0418   LEUKOCYTESUR NEGATIVE 09/22/2014 0418   Urine Drug Screen     Component  Value Date/Time   LABOPIA NONE DETECTED 09/22/2014 0418   COCAINSCRNUR NONE DETECTED 09/22/2014 0418   LABBENZ NONE DETECTED 09/22/2014 0418   AMPHETMU NONE DETECTED 09/22/2014 0418   THCU NONE DETECTED 09/22/2014 0418   LABBARB NONE DETECTED 09/22/2014 0418    Alcohol Level    Component Value Date/Time   ETH <11 09/22/2014 0401     SIGNIFICANT DIAGNOSTIC STUDIES  Ct Head Wo Contrast  09/22/2014 No acute intracranial abnormalities. Mild atrophy and small vessel ischemic changes.   Mr Brain Wo Contrast  09/22/2014 Widespread areas of restricted diffusion are seen throughout the brain, most notable in the LEFT paramedian pons, consistent with multiple areas of acute infarction. No areas of hemorrhagic transformation. These lie within different vascular territories. A shower of emboli is  not excluded .  Mr Maxine GlennMra Head/brain Wo Cm  09/22/2014 No proximal flow reducing lesion is evident on intracranial MRA.   Carotid Doppler No evidence of hemodynamically significant internal carotid artery stenosis. Vertebral artery flow is antegrade.   2D Echocardiogram EF 55-60% with no source of embolus. Mild concentric hypertrophy.     HISTORY OF PRESENT ILLNES Jean Ward is a 80 y.o. female with a history of diabetes mellitus and hyperlipidemia presenting with acute slurred speech and right facial droop as well as weakness of right arm and right leg that started 12:00 AM on 09/22/2014. Patient had been taking aspirin daily. There is no previous history of stroke or TIA. CT scan of her head showed no acute intracranial abnormality. She was last seen well at midnight. Patient woke up at 2 AM on the day of admission with presenting deficits. NIH stroke score was 13. Patient was deemed a candidate for TPA administration which was started. She was admitted to the neuro ICU for further evaluation and treatment.  HOSPITAL COURSE As noted the patient received TPA for acute infarcts and was admitted to the neuro intensive care unit. The patient did not speak English and this makes communication difficult although there were multiple family members available for translations. A TEE was obtained from cardiology to evaluate for a source of emboli. This study revealed a PFO as well as a severe non mobile plaque in the descending thoracic aorta and distal portion of the aortic arch. Lower extremity Dopplers were performed for further evaluation. There was no evidence of DVT. A loop recorder was also implanted by cardiology. The patient was transferred out of the intensive care unit. She participated in physical and occupational therapy and it was eventually felt that she would benefit from inpatient rehabilitation. The patient apparently had a fall on 09/24/2014. She subsequently had left hip pain; however, an  x-ray of the left hip showed no acute findings. Arrangements were eventually made to admit the patient to the rehabilitation unit on 09/28/2014.  DISCHARGE EXAM Blood pressure 127/61, pulse 73, temperature 98.2 F (36.8 C), temperature source Oral, resp. rate 20, height 5' (1.524 m), weight 182 lb 1.6 oz (82.6 kg), SpO2 94.00%.   Pleasant elderly Hispanic lady currently not in distress.Awake alert. Afebrile. Head is nontraumatic. Neck is supple without bruit. Hearing is normal. Cardiac exam no murmur or gallop. Lungs are clear to auscultation. Distal pulses are well felt.  Neurological Exam ; exam limited due to requiring an interpreter  Awake Alert . Normal speech and language.eye movements full without nystagmus.fundi were not visualized. Vision acuity and fields appear normal. Hearing is normal. Palatal movements are normal. Face symmetric. Tongue midline.  Normal strength, tone, reflexes and  coordination. Normal sensation. Gait deferred.  Discharge Diet  Carb Control with thin liquids  DISCHARGE PLAN  Disposition:  Transfer to [facility] for ongoing PT, OT and ST  clopidogrel 75 mg orally every day for secondary stroke prevention.  Recommend ongoing risk factor control by Primary Care Physician at time of discharge from inpatient rehabilitation. Risk factor recommendations:  Hypertension target range 130-140/70-80 Lipid range - LDL < 100 and checked every 6 months, fasting Diabetes - HgB A1C <7   Follow-up No primary provider on file. in 2 weeks following discharge from rehab.  Follow-up with Dr. Delia Heady, Stroke Clinic in 2 months.  30 minutes were spent preparing discharge.  Delton See PA-C [facility] Pager [phone] 09/29/2014, 6:05 PM   Signed

## 2014-09-27 NOTE — Progress Notes (Signed)
Noted hip xray negative. I contacted PA  For Stroke service and he is aware we can admit pt to inpt rehab today. I have met with pt and her daughter at bedside and they are in agreement to admission today. I will make the arrangements. [phone]

## 2014-09-27 NOTE — Op Note (Signed)
SURGEON:  Hillis RangeJames Tarsha Blando, MD     PREPROCEDURE DIAGNOSIS:  Cryptogenic Stroke    POSTPROCEDURE DIAGNOSIS:  Cryptogenic Stroke     PROCEDURES:   1. Implantable loop recorder implantation    INTRODUCTION:  Jean Ward is a 80 y.o. female with a history of unexplained stroke who presents today for implantable loop implantation.  The patient has had a cryptogenic stroke.  Despite an extensive workup by neurology, no reversible causes have been identified.  she has worn telemetry during which she did not have arrhythmias.  There is significant concern for possible atrial fibrillation as the cause for the patients stroke.  The patient therefore presents today for implantable loop implantation.     DESCRIPTION OF PROCEDURE:  Informed written consent was obtained, and the patient was brought to the electrophysiology lab in a fasting state.  The patient required no sedation for the procedure today.  Mapping over the patient's chest was performed by the EP lab staff to identify the area where electrograms were most prominent for ILR recording.  This area was found to be the left parasternal region over the 3rd-4th intercostal space. The patients left chest was therefore prepped and draped in the usual sterile fashion by the EP lab staff. The skin overlying the left parasternal region was infiltrated with lidocaine for local analgesia.  A 0.5-cm incision was made over the left parasternal region over the 3rd intercostal space.  A subcutaneous ILR pocket was fashioned using a combination of sharp and blunt dissection.  A Medtronic Reveal NewsomsLinq model [id number] SN [id number] S implantable loop recorder was then placed into the pocket  R waves were very prominent and measured 0.258mV. EBL<1 ml.  Steri- Strips and a sterile dressing were then applied.  There were no early apparent complications.     CONCLUSIONS:   1. Successful implantation of a Medtronic Reveal LINQ implantable loop recorder for cryptogenic stroke  2. No  early apparent complications.

## 2014-09-27 NOTE — PMR Pre-admission (Signed)
PMR Admission Coordinator Pre-Admission Assessment  Patient: Jean Ward is an 80 y.o., female MRN: [id number] DOB: [birth year] Height: 5' (152.4 cm) Weight: 82.6 kg (182 lb 1.6 oz)              Insurance Information HMO:     PPO:      PCP:      IPA:      80/20: yes     OTHER: no HMO PRIMARY: medicare a and b      Policy#: [id number]      Subscriber: pt Benefits:  Phone #: online     Name: 09/26/2014 Eff. Date: 09/19/2000     Deduct: $1260      Out of Pocket Max: none      Life Max: none CIR: 100%      SNF: 2o full days Outpatient: 80%     Co-Pay: 20% Home Health: 100%      Co-Pay: none DME: 80%     Co-Pay: 20% Providers: pt choice  SECONDARY: Medicaid [place] [employer]      Policy#: [id number]     Subscriber: pt  Medicaid Application Date:       Case Manager:  Disability Application Date:       Case Worker:   Emergency Contact Information Contact Information   Name Relation Home Work Mobile   Ward,Jean    [phone]     Current Medical History  Patient Admitting Diagnosis: embolic CVA  History of Present Illness: Jean Ward is a 80 y.o. Hispanic female with history of DM type 2, hyperlipidemia who was admitted on 09/22/14 with slurred speech, right sided weakness and right facial droop. CT head negative for abnormality and she was treated with tPA with improvements. MRI/MRA brain done revealing widespread areas of restricted diffusion are seen throughout the brain, most notable in the LEFT paramedian pons, consistent with multiple areas of acute infarction question emboli. 2D echo with EF 55-60% and no wall abnormality. Carotid dopplers without significant ICA stenosis. ST evaluation done revealing mild dysarthria and intact cognitive linguistic function.  TEE PFO with incidental finding of PFO. Severe non-mobile plaque in the descending thoracic aorta and distal portion of the aortic arch. LE dopplers negative. Loop placed 10/9. Aspirin 81 mg orally every day prior to  admission, now on clopidogrel 75 mg orally every day.  Aphasia has resolved. r HP worsened.   Pt fell am of 10/5 from bed. Negative xrays on 10/9 for acute fracture.   Total: 0 NIH    Past Medical History  Past Medical History  Diagnosis Date  . Diabetes mellitus without complication   . Hypercholesteremia     Family History  family history is not on file.  Prior Rehab/Hospitalizations: none   Current Medications  Current facility-administered medications:acetaminophen (TYLENOL) suppository 650 mg, 650 mg, Rectal, Q4H PRN, Noel Christmas;  acetaminophen (TYLENOL) tablet 650 mg, 650 mg, Oral, Q4H PRN, Noel Christmas, 650 mg at 09/25/14 2006;  clopidogrel (PLAVIX) tablet 75 mg, 75 mg, Oral, Daily, Layne Benton, NP, 75 mg at 09/27/14 1048 insulin aspart (novoLOG) injection 0-24 Units, 0-24 Units, Subcutaneous, TID AC & HS, Delia Heady, MD, 2 Units at 09/26/14 2233;  labetalol (NORMODYNE,TRANDATE) injection 10 mg, 10 mg, Intravenous, Q10 min PRN, Noel Christmas;  linagliptin (TRADJENTA) tablet 5 mg, 5 mg, Oral, Daily, Layne Benton, NP, 5 mg at 09/27/14 1048;  omega-3 acid ethyl esters (LOVAZA) capsule 1 g, 1 g, Oral, Daily, Layne Benton, NP, 1 g at 09/27/14 1048  ondansetron (ZOFRAN) injection 4 mg, 4 mg, Intravenous, Q6H PRN, Noel Christmasharles Stewart, 4 mg at 09/23/14 0459;  pantoprazole (PROTONIX) EC tablet 40 mg, 40 mg, Oral, QHS, Delia HeadyPramod Sethi, MD, 40 mg at 09/26/14 2227;  simvastatin (ZOCOR) tablet 40 mg, 40 mg, Oral, q1800, Layne BentonSharon L Biby, NP, 40 mg at 09/26/14 1720  Patients Current Diet: Carb Control with thin liquids  Precautions / Restrictions Precautions Precautions: Fall Precaution Comments: r inattention Restrictions Weight Bearing Restrictions: No   Prior Activity Level Limited Community (1-2x/wk): very active and I pta. Does everything for herself. Has never driven.  Home Assistive Devices / Equipment Home Assistive Devices/Equipment: None Home Equipment: None  Prior  Functional Level Prior Function Level of Independence: Independent  Current Functional Level Cognition  Overall Cognitive Status: Within Functional Limits for tasks assessed Difficult to assess due to: Non-English speaking (daughter present to translate) Orientation Level: Oriented X4    Extremity Assessment (includes Sensation/Coordination)          ADLs  Overall ADL's : Needs assistance/impaired Grooming: Wash/dry hands;Minimal assistance;Standing Upper Body Bathing: Set up;Sitting Lower Body Bathing: Minimal assistance;Sit to/from stand Upper Body Dressing : Minimal assistance;Sitting Lower Body Dressing: Minimal assistance;Sit to/from stand Toilet Transfer: Minimal assistance;Ambulation;Regular Toilet Toileting- Clothing Manipulation and Hygiene: Minimal assistance;Sit to/from stand Functional mobility during ADLs: Maximal assistance General ADL Comments: Pt requires (A) to exit bed. Pt unable to come from supine <>Sit eob on the right side. Pt asking for help stating she is unable to continue. pt noted to have large L bruise on L ue. Pt static sitting with R lateral lean and decr R core muscle activation. pt sit<>Stand max (A). pt with decr balance and posterior lean. pt demonstrates ankle strategies attemptign to remain standing. pt asked to march in place. pt lifting R LE and demonstrates inabiliyt to lift L LE. pt buckling. pt with poor pregait demo. Pt asked to side step to the bottom of bed and unable to complete but 1 step Pt liable at eob due to frustration with new changes. Pt describes a clear medial divide of body sensation R vs L. pt states "it does not feel like its my body"    Mobility  Overal bed mobility: Needs Assistance Bed Mobility: Supine to Sit Rolling: Min assist Supine to sit: Max assist Sit to supine: Max assist General bed mobility comments: requries (A) to push with R ue into sitting    Transfers  Overall transfer level: Needs assistance Equipment  used: Rolling walker (2 wheeled) Transfers: Sit to/from Stand Sit to Stand: Mod assist Stand pivot transfers: Min guard General transfer comment: pt with difficulty powering up to stand; cues for hand placement and safety; (A) to facilitate Rt UE onto RW     Ambulation / Gait / Stairs / Wheelchair Mobility  Ambulation/Gait Ambulation/Gait assistance: Mod assist Ambulation Distance (Feet): 100 Feet Assistive device: Rolling walker (2 wheeled) Gait Pattern/deviations: Drifts right/left;Narrow base of support;Shuffle;Decreased stride length;Decreased dorsiflexion - right;Decreased weight shift to right Gait velocity: decreased Gait velocity interpretation: Below normal speed for age/gender General Gait Details: mod (A) to manage RW and maintain balance; pt veering in hallway throughout session and required multimodal cues for upright posture and safety with RW ; pt is a high fall risk     Posture / Balance Dynamic Sitting Balance Sitting balance - Comments: pt leaning Lt at EOB; required (A) to acheive midline at EOB and UE support at all times     Special needs/care consideration Bowel mgmt: continent  Bladder mgmt: continent Loop placement 10/9. Pt and family education needed prior to d/c   Previous Home Environment Living Arrangements: Children;Other (Comment) (lives with dtr and her two teenage daughters)  Lives With: Family Available Help at Discharge: Family;Available PRN/intermittently Type of Home: Other(Comment) (townhome) Home Layout: Two level;Able to live on main level with bedroom/bathroom Alternate Level Stairs-Number of Steps: flight Home Access: Stairs to enter Entrance Stairs-Number of Steps: 4 to 5 Bathroom Shower/Tub: Engineer, manufacturing systemsTub/shower unit Bathroom Toilet: Standard Bathroom Accessibility: Yes How Accessible: Accessible via walker Home Care Services: No Additional Comments: pt's bedroom upstairs, but daughter to switch her to downstairs bathroom  Discharge Living  Setting Plans for Discharge Living Setting: Other (Comment) (two level townhome) Type of Home at Discharge: Other (Comment) (townhome) Discharge Home Layout: Two level;Able to live on main level with bedroom/bathroom Discharge Home Access: Stairs to enter Entrance Stairs-Rails: Right;Left;Can reach both Entrance Stairs-Number of Steps: 4 to 5 Discharge Bathroom Shower/Tub: Tub/shower unit Discharge Bathroom Toilet: Standard Discharge Bathroom Accessibility: Yes How Accessible: Accessible via walker Does the patient have any problems obtaining your medications?: No  Social/Family/Support Systems Patient Roles: Parent Contact Information: Rica MastEvelyn Kozlov, daughter Anticipated Caregiver: daughter, intermittient Anticipated Caregiver's Contact Information: see above Ability/Limitations of Caregiver: daughter works Engineer, structuralCaregiver Availability: Intermittent Discharge Plan Discussed with Primary Caregiver: Yes Is Caregiver In Agreement with Plan?: Yes Does Caregiver/Family have Issues with Lodging/Transportation while Pt is in Rehab?: No  Goals/Additional Needs Patient/Family Goal for Rehab: Mod I with PT and OT Expected length of stay: ELOS 7 days Cultural Considerations: [place] originally form [place] Special Service Needs: Spanish Interpretor to be arranged for evaluations. Daughter interprets when here. Patient understands English, but speaks very little [place] Additional Information: Patient fell in hospital on [address] on 10/6 at 5 am Pt/Family Agrees to Admission and willing to participate: Yes Program Orientation Provided & Reviewed with Pt/Caregiver Including Roles  & Responsibilities: Yes  Decrease burden of Care through IP rehab admission: n/a  Possible need for SNF placement upon discharge:no  Patient Condition: This patient's medical and functional status has changed since the consult dated: 09/23/2014 in which the Rehabilitation Physician determined and documented that  the patient's condition is appropriate for intensive rehabilitative care in an inpatient rehabilitation facility. See "History of Present Illness" (above) for medical update. Functional changes are: overall mod assist. Patient's medical and functional status update has been discussed with the Rehabilitation physician and patient remains appropriate for inpatient rehabilitation. Will admit to inpatient rehab today.  Preadmission Screen Completed By:  Clois DupesBoyette, Kwane Rohl Godwin, 09/27/2014 11:49 AM ______________________________________________________________________   Discussed status with Dr. Riley KillSwartz on 09/27/2014 at 1212 and received telephone approval for admission today.  Admission Coordinator:  Clois DupesBoyette, Catalyna Reilly Godwin, time [id number] Date 09/27/2014.

## 2014-09-27 NOTE — Interval H&P Note (Signed)
History and Physical Interval Note:  09/27/2014 8:48 AM  Jean Ward  has presented today for surgery, with the diagnosis of Cryptogenic stroke  The various methods of treatment have been discussed with the patient and family. After consideration of risks, benefits and other options for treatment, the patient has consented to  Procedure(s): LOOP RECORDER IMPLANT (N/A) as a surgical intervention .  The patient's history has been reviewed, patient examined, no change in status, stable for surgery.  I have reviewed the patient's chart and labs.  Questions were answered to the patient's satisfaction.     Hillis RangeAllred, Kanav Kazmierczak

## 2014-09-27 NOTE — Progress Notes (Signed)
Pt reported pain radiating down left arm from shoulder to wrist and this RN noticed that loop recorder dressing was completely saturated with blood. Paged Dr Thad Rangereynolds and called cath lab. Cath lab paged Azalee CourseHao Meng, [place]. Reynolds and ALLTEL CorporationMeng assessed patient and advised this RN to hold pressure to the site for 30 minutes. Bleeding resided. No further orders given. Pt transferred to [facility] as advised by MD. Jovita GammaGave report to WestlandStacey, RN on [facility] who is aware of the situation and will continue to monitor.

## 2014-09-27 NOTE — Progress Notes (Signed)
Dicussed ongoing left hip pain as well as reports of  Fall on 10/06 with Delton Seeavid Rinehuls PAC. Will order films for evaluation per our discussion..Marland Kitchen

## 2014-09-27 NOTE — H&P (View-Only) (Signed)
ELECTROPHYSIOLOGY CONSULT NOTE  [name] MRN: [id number], DOB/AGE: 1933-11-05   Admit date: 09/22/2014 Date of Consult: 09/26/2014  Primary Physician: No primary provider on file. Primary Cardiologist[facility] Reason for Consultation: Cryptogenic stroke; recommendations regarding Implantable Loop Recorder  History of Present Illness Jean Ward was admitted on 09/22/2014 with slurred speech and right facial droop.  Imaging demonstrated multiple areas of infarct most notable in the left paramedian pons.  She has undergone workup for stroke including echocardiogram and carotid dopplers.  The patient has been monitored on telemetry which has demonstrated sinus rhythm with no arrhythmias.  TEE demonstrated no cause for emboli, small PFO.  LE dopplers were negative for DVT.  Echocardiogram this admission demonstrated EF 60-65%, no RWMA, LA 33.  Lab work is reviewed.  Prior to admission, the patient denies chest pain, shortness of breath, dizziness, palpitations, or syncope.  They are recovering from their stroke with plans to participate in inpatient rehab at discharge.  EP has been asked to evaluate for placement of an implantable loop recorder to monitor for atrial fibrillation.  ROS is negative except as outlined above.    Past Medical History  Diagnosis Date  . Diabetes mellitus without complication   . Hypercholesteremia      Surgical History:  Past Surgical History  Procedure Laterality Date  . Hernia repair    . Tee without cardioversion N/A 09/24/2014    Procedure: TRANSESOPHAGEAL ECHOCARDIOGRAM (TEE);  Surgeon: Lars MassonKatarina H Nelson, MD;  [facility];  Service: Cardiovascular;  Laterality: N/A;     Prescriptions prior to admission  Medication Sig Dispense Refill  . aspirin EC 81 MG tablet Take 81 mg by mouth daily.      Marland Kitchen. omega-3 acid ethyl esters (LOVAZA) 1 G capsule Take 1 g by mouth daily.      Marland Kitchen. omeprazole (PRILOSEC) 20 MG  capsule Take 40 mg by mouth daily.      . simvastatin (ZOCOR) 40 MG tablet Take 40 mg by mouth daily.      . sitaGLIPtin (JANUVIA) 100 MG tablet Take 100 mg by mouth daily.        Inpatient Medications:  . clopidogrel  75 mg Oral Daily  . insulin aspart  0-24 Units Subcutaneous TID AC & HS  . linagliptin  5 mg Oral Daily  . omega-3 acid ethyl esters  1 g Oral Daily  . pantoprazole  40 mg Oral QHS  . pneumococcal 23 valent vaccine  0.5 mL Intramuscular Tomorrow-1000  . simvastatin  40 mg Oral q1800    Allergies: No Known Allergies  History   Social History  . Marital Status: Unknown    Spouse Name: N/A    Number of Children: N/A  . Years of Education: N/A   Occupational History  . Not on file.   Social History Main Topics  . Smoking status: Never Smoker   . Smokeless tobacco: Not on file  . Alcohol Use: Not on file  . Drug Use: Not on file  . Sexual Activity: Not on file   Other Topics Concern  . Not on file   Social History Narrative  . No narrative on file     Family History - patient is unsure of family history  BP 128/41  Pulse 82  Temp(Src) 98.1 F (36.7 C) (Oral)  Resp 20  Ht 5' (1.524 m)  Wt 182 lb 1.6 oz (82.6 kg)  BMI 35.56 kg/m2  SpO2 97%  Physical  Exam: Physical Exam: Filed Vitals:   09/26/14 0956 09/26/14 1352 09/26/14 1746 09/26/14 2054  BP: 134/49 128/41 140/51 141/54  Pulse: 79 82 77 75  Temp: 98.2 F (36.8 C) 98.1 F (36.7 C) 98.3 F (36.8 C) 98 F (36.7 C)  TempSrc: Oral Oral Oral Oral  Resp: 20 20 20    Height:      Weight:      SpO2: 97% 97% 96% 94%    GEN- The patient is pleasant appearing, alert and oriented x 3 today.   Head- normocephalic, atraumatic Eyes-  Sclera clear, conjunctiva pink Ears- hearing intact Oropharynx- clear Neck- supple, Lungs- Clear to ausculation bilaterally, normal work of breathing Heart- Regular rate and rhythm, no murmurs, rubs or gallops, PMI not laterally displaced GI- soft, NT, ND, +  BS Extremities- no clubbing, cyanosis, or edema  MS- no significant deformity or atrophy Skin- no rash or lesion Psych- euthymic mood, full affect,  English is difficult.  She is happy to interact with me despite my limited spanish     Labs:   Lab Results  Component Value Date   WBC 5.3 09/22/2014   HGB 14.3 09/22/2014   HCT 42.0 09/22/2014   MCV 87.9 09/22/2014   PLT 149* 09/22/2014    Recent Labs Lab 09/22/14 0401 09/22/14 0408  NA 138 139  K 4.0 3.8  CL 101 103  CO2 26  --   BUN 18 18  CREATININE 0.74 0.80  CALCIUM 9.3  --   PROT 7.0  --   BILITOT 0.4  --   ALKPHOS 56  --   ALT 15  --   AST 23  --   GLUCOSE 148* 150*    Radiology/Studies: Ct Head Wo Contrast 09/23/2014   CLINICAL DATA:  Followup stroke.  24 hr status post tPA.  EXAM: CT HEAD WITHOUT CONTRAST  TECHNIQUE: Contiguous axial images were obtained from the base of the skull through the vertex without intravenous contrast.  COMPARISON:  Prior MRI from 09/22/2014  FINDINGS: Multiple previously identified small ischemic infarcts involving the left paramedian pons, right occipital lobe, temporal lobe, left frontal and left temporal lobes are not well visualized. No evidence of hemorrhagic transformation status post tPA. No definite new large vessel territory infarct.  No midline shift. No hydrocephalus. No extra-axial fluid collection.  Calvarium intact.  Scalp soft tissues unremarkable.  No acute abnormality seen about either orbit.  Paranasal sinuses and mastoid air cells are clear.  IMPRESSION: 1. Poor visualization of previously identified small multi focal acute ischemic infarcts involving the brain as seen on prior MRI from 09/22/2014. No evidence of hemorrhagic transformation or other bleed status post tPA. 2. No new acute intracranial process.   Electronically Signed   By: Rise Mu M.D.   On: 09/23/2014 05:45    Mr Brain Wo Contrast 09/22/2014   CLINICAL DATA:  RIGHT-sided weakness and dysarthria which  began at midnight 09/22/2014. Stroke risk factors include diabetes and hypertension. TPA was administered  EXAM: MRI HEAD WITHOUT CONTRAST  MRA HEAD WITHOUT CONTRAST  TECHNIQUE: Multiplanar, multiecho pulse sequences of the brain and surrounding structures were obtained without intravenous contrast. Angiographic images of the head were obtained using MRA technique without contrast.  COMPARISON:  CT head earlier in the day  FINDINGS: MRI HEAD FINDINGS  Widespread areas of restricted diffusion are seen throughout the brain consistent with multiple areas of acute infarction. These include the LEFT paramedian pons, RIGHT occipital lobe, RIGHT posterior temporal lobe, LEFT posterior  frontal cortex, and LEFT posterior temporal lobe. There are no areas of hemorrhage, mass lesion, hydrocephalus, or extra-axial fluid. Mild cerebral and cerebellar atrophy is present. Mild subcortical and periventricular T2 and FLAIR hyperintensities, likely chronic microvascular ischemic change. Normal pituitary and cerebellar tonsils. Mild pannus. Mild cervical spondylosis. Flow voids are maintained. BILATERAL cataract extraction. No sinus or mastoid disease. Compared with prior CT, the infarcts are not visible.  MRA HEAD FINDINGS  The internal carotid arteries are widely patent. The basilar artery is widely patent with the LEFT vertebral the dominant/sole contributor. RIGHT vertebral contributes to PICA. There is no visible intracranial stenosis or berry aneurysm. No distal MCA and PCA irregularity. 2 mm medial wide necked outpouching from the superior cavernous LEFT ICA consistent with atheromatous disease.  IMPRESSION: Widespread areas of restricted diffusion are seen throughout the brain, most notable in the LEFT paramedian pons, consistent with multiple areas of acute infarction. No areas of hemorrhagic transformation. These lie within different vascular territories. A shower of emboli is not excluded. See discussion above.  No proximal  flow reducing lesion is evident on intracranial MRA.   Electronically Signed   By: Davonna BellingJohn  Curnes M.D.   On: 09/22/2014 16:46   12-lead ECG sinus rhythm  Telemetry sinus rhythm  TEE is reviewed and reveals no thrombus.  PFO was noted BLE dopplers are reviewed and reveal no DVT.  Assessment and Plan:  1. Cryptogenic stroke The patient presents with cryptogenic stroke.  TEE and dopplers are reviewed.  I spoke at length with the patient about monitoring for afib with an implantable loop recorder.  Risks, benefits, and alteratives to implantable loop recorder were discussed with the patient today.   At this time, the patient is very clear in their decision to proceed with implantable loop recorder.  I will therefore plan to proceed with ILR placement first thing tomorrow morning.  Please call with questions.

## 2014-09-28 ENCOUNTER — Inpatient Hospital Stay (HOSPITAL_COMMUNITY): Payer: Medicare Other | Admitting: Occupational Therapy

## 2014-09-28 ENCOUNTER — Inpatient Hospital Stay (HOSPITAL_COMMUNITY): Payer: Medicare Other | Admitting: Physical Therapy

## 2014-09-28 LAB — GLUCOSE, CAPILLARY
Glucose-Capillary: 140 mg/dL — ABNORMAL HIGH (ref 70–99)
Glucose-Capillary: 155 mg/dL — ABNORMAL HIGH (ref 70–99)
Glucose-Capillary: 126 mg/dL — ABNORMAL HIGH (ref 70–99)
Glucose-Capillary: 129 mg/dL — ABNORMAL HIGH (ref 70–99)

## 2014-09-28 NOTE — Progress Notes (Signed)
Occupational Therapy Assessment and Plan  Patient Details  Name: Jean Ward MRN: [id number] Date of Birth: 15-Dec-1933  OT Diagnosis: abnormal posture, hemiplegia affecting dominant side and muscle weakness (generalized) Rehab Potential: Rehab Potential: Good ELOS: 14 days   Today's Date: 09/28/2014 OT Individual Time: 0900-1000 OT Individual Time Calculation (min): 60 min     Problem List:  Patient Active Problem List   Diagnosis Date Noted  . CVA (cerebral infarction) 09/22/2014  . Diabetes mellitus type 2, controlled, without complications 40/07/6760    Past Medical History:  Past Medical History  Diagnosis Date  . Diabetes mellitus without complication   . Hypercholesteremia    Past Surgical History:  Past Surgical History  Procedure Laterality Date  . Hiatal hernia repair    . Tee without cardioversion N/A 09/24/2014    Procedure: TRANSESOPHAGEAL ECHOCARDIOGRAM (TEE);  Surgeon: Dorothy Spark, MD;  Location: [facility] ENDOSCOPY;  Service: Cardiovascular;  Laterality: N/A;    Assessment & Plan Clinical Impression: Patient is a 80 y.o. year old female  ([place] descent) with history of DM type 2, hyperlipidemia who was admitted on 09/22/14 with slurred speech, right sided weakness and right facial droop. CT head negative for abnormality and she was treated with tPA with improvements. MRI/MRA brain done revealing widespread areas of restricted diffusion are seen throughout the brain, most notable in the LEFT paramedian pons, consistent with multiple areas of acute infarction question emboli. 2D echo with EF 55-60% and no wall abnormality. Carotid dopplers without significant ICA stenosis. ST evaluation done revealing mild dysarthria and intact cognitive linguistic function. TEE without thrombus but positive bubble study and severe non-mobile plaque in descending thoracic aorta and distal portion of aortic arch. BLE dopplers done and negative for DVT. On 10/07, she had worsening  of right sided weakness with increase in pain and numbness RLE as well as decrease in vision right eye. Loop recorder placed by Dr. Rayann Heman on 10/09. She has continued to complain of left hip pain and X rays done revealing mild degenerative changes and no fracture. She continues to have balance deficits with decreased coordination, poor safety and RUE inattention. Patient transferred to CIR on 09/27/2014 .    Patient currently requires mod with basic self-care skills secondary to muscle weakness, decreased coordination and decreased standing balance, decreased postural control, hemiplegia and decreased balance strategies.  Prior to hospitalization, patient could complete ADLs and IADLs with independent .  Patient will benefit from skilled intervention to increase independence with basic self-care skills prior to discharge home with care partner.  Anticipate patient will require intermittent supervision and follow up TBD.      Skilled Therapeutic Intervention Pt transitioned easily from PT evaluation with no c/o pain this session. OT evaluation initiated and completed this session. Treatment with focus on ADL retraining, STS, functional transfers, and dynamic standing balance. Pt completed ADL tasks seated at sink side. STS x 5 reps during LB dressing and bathing with Min A. Pt requiring min - mod A for dynamic standing balance during LB tasks. Pt educated on OT purpose, POC, and goals with patient acceptance.   OT Evaluation Precautions/Restrictions  Precautions Precautions: Fall Precaution Comments: R inattention Restrictions Weight Bearing Restrictions: No General Chart Reviewed: Yes Vital Signs Therapy Vitals Temp: 98.5 F (36.9 C) Temp Source: Oral Pulse Rate: 66 Resp: 18 BP: 114/64 mmHg Patient Position (if appropriate): Lying Oxygen Therapy SpO2: 99 % O2 Device: None (Room air) Pain Pain Assessment Pain Assessment: No/denies pain Pain Score: 0-No pain Home  Living/Prior  Functioning Home Living Family/patient expects to be discharged to:: Private residence Living Arrangements: Children;Other (Comment) (two teenagers grand  daughters) Available Help at Discharge: Family;Available PRN/intermittently;Other (Comment) (lives with daughter who works 8-5 daytime and son in Social worker currently unemployed) Type of Home: Other(Comment) (town house) Home Access: Stairs to enter Secretary/administrator of Steps: 2 stairs, landing, then 1 threshold Entrance Stairs-Rails: Can reach both Home Layout: Able to live on main level with bedroom/bathroom Alternate Level Stairs-Number of Steps: flight Additional Comments: pt's bedroom upstairs, but daughter to switch her to downstairs bathroom  Lives With: Family IADL History Homemaking Responsibilities: Yes Meal Prep Responsibility: Primary Laundry Responsibility: Primary Cleaning Responsibility: Primary Current License: No Occupation: Retired Type of Occupation: housekeeping for hotel Prior Function Level of Independence: Independent with basic ADLs;Independent with homemaking with ambulation;Independent with transfers;Independent with gait  Able to Take Stairs?: Yes Driving: No Vocation: Retired Leisure: Hobbies-yes (Comment) Comments: Per daughter, pt enjoys cleaning home daily while grandkids are at school, daughter at work Vision/Perception  Vision- History Baseline Vision/History: No visual deficits Continue to assess in functional activities  Cognition Orientation Level: Oriented X4 Comments: cognition difficult to determine during OT evaluation secondary to language barrier Sensation Sensation Light Touch: Impaired by gross assessment Light Touch Impaired Details: Absent RUE;Absent RLE Stereognosis: Not tested Hot/Cold: Appears Intact Proprioception: Not tested Coordination Gross Motor Movements are Fluid and Coordinated: No Fine Motor Movements are Fluid and Coordinated: No Coordination and Movement  Description: decreased speed in R hand for rapid opposition Motor  Motor Motor: Hemiplegia Motor - Skilled Clinical Observations: decreased balance, decreased strength in R UE and R LE Mobility  Transfers Sit to Stand: 4: Min assist;From chair/3-in-1 Sit to Stand Details: Verbal cues for precautions/safety;Tactile cues for weight shifting  Trunk/Postural Assessment  Cervical Assessment Cervical Assessment: Within Functional Limits Thoracic Assessment Thoracic Assessment: Within Functional Limits Lumbar Assessment Lumbar Assessment: Within Functional Limits Postural Control Postural Control: Within Functional Limits  Balance Balance Balance Assessed: Yes Dynamic Standing Balance Dynamic Standing - Balance Support: Bilateral upper extremity supported;During functional activity Dynamic Standing - Level of Assistance: 4: Min assist Dynamic Standing - Comments: LB bathing and dressing standing at sink side Extremity/Trunk Assessment RUE Assessment RUE Assessment: Exceptions to [facility] RUE AROM (degrees) Overall AROM Right Upper Extremity: Deficits (Pt with decreased shoulder flexion and abduction secondary to  reported pain from pt. PROM is WFLs) RUE Strength RUE Overall Strength Comments: 3-/5 for shoulder elevation and abduction and grip strength, 3+/5 for wrist and elbow LUE Assessment LUE Assessment: Within Functional Limits  FIM:  FIM - Eating Eating Activity: 7: Complete independence:no helper FIM - Grooming Grooming Steps: Wash, rinse, dry face;Wash, rinse, dry hands;Oral care, brush teeth, clean dentures;Brush, comb hair Grooming: 5: Set-up assist to obtain items FIM - Bathing Bathing Steps Patient Completed: Chest;Right Arm;Left Arm;Abdomen;Right upper leg;Left upper leg Bathing: 3: Mod-Patient completes 5-7 81f 10 parts or 50-74% FIM - Upper Body Dressing/Undressing Upper body dressing/undressing steps patient completed: Thread/unthread left sleeve of pullover  shirt/dress;Thread/unthread right sleeve of pullover shirt/dresss Upper body dressing/undressing: 3: Mod-Patient completed 50-74% of tasks FIM - Lower Body Dressing/Undressing Lower body dressing/undressing steps patient completed: Pull pants up/down;Pull underwear up/down Lower body dressing/undressing: 2: Max-Patient completed 25-49% of tasks FIM - Banker Devices: Arm rests FIM - Archivist Transfers: 3-To toilet/BSC: Mod A (lift or lower assist);3-From toilet/BSC: Mod A (lift or lower assist) FIM - Tub/Shower Transfers Tub/shower Transfers: 0-Activity did not occur or was simulated  Refer to Care Plan for Long Term Goals  Recommendations for other services: None  Discharge Criteria: Patient will be discharged from OT if patient refuses treatment 3 consecutive times without medical reason, if treatment goals not met, if there is a change in medical status, if patient makes no progress towards goals or if patient is discharged from hospital.  The above assessment, treatment plan, treatment alternatives and goals were discussed and mutually agreed upon: by patient  Phineas Semen 09/28/2014, 4:38 PM

## 2014-09-28 NOTE — Evaluation (Signed)
Physical Therapy Assessment and Plan  Patient Details  Name: Jean Ward MRN: [id number] Date of Birth: September 29, 1933  PT Diagnosis: Abnormal posture, Abnormality of gait, Hemiplegia dominant, Impaired sensation and Muscle weakness Rehab Potential: Excellent ELOS: 14 days   Today's Date: 09/28/2014 PT Individual Time: 0800-0900 and 1104-1204 PT Individual Time Calculation (min): 60 min and 60 min    Problem List:  Patient Active Problem List   Diagnosis Date Noted  . CVA (cerebral infarction) 09/22/2014  . Diabetes mellitus type 2, controlled, without complications 85/46/2703    Past Medical History:  Past Medical History  Diagnosis Date  . Diabetes mellitus without complication   . Hypercholesteremia    Past Surgical History:  Past Surgical History  Procedure Laterality Date  . Hiatal hernia repair    . Tee without cardioversion N/A 09/24/2014    Procedure: TRANSESOPHAGEAL ECHOCARDIOGRAM (TEE);  Surgeon: Dorothy Spark, MD;  Location: [facility] ENDOSCOPY;  Service: Cardiovascular;  Laterality: N/A;    Assessment & Plan Clinical Impression: Jean Ward is a 80 y.o. RH- female ([place] descent) with history of DM type 2, hyperlipidemia who was admitted on 09/22/14 with slurred speech, right sided weakness and right facial droop. CT head negative for abnormality and she was treated with tPA with improvements. MRI/MRA brain done revealing widespread areas of restricted diffusion are seen throughout the brain, most notable in the LEFT paramedian pons, consistent with multiple areas of acute infarction question emboli. 2D echo with EF 55-60% and no wall abnormality. Carotid dopplers without significant ICA stenosis. ST evaluation done revealing mild dysarthria and intact cognitive linguistic function. TEE without thrombus but positive bubble study and severe non-mobile plaque in descending thoracic aorta and distal portion of aortic arch. BLE dopplers done and negative for DVT. On  10/07, she had worsening of right sided weakness with increase in pain and numbness RLE as well as decrease in vision right eye. Loop recorder placed by Dr. Rayann Heman on 10/09. She has continued to complain of left hip pain and X rays done revealing mild degenerative changes and no fracture. She continues to have balance deficits with decreased coordination, poor safety and RUE inattention. Patient transferred to CIR on 09/27/2014 .   Patient currently requires mod with mobility secondary to muscle weakness, impaired timing and sequencing and unbalanced muscle activation, decreased attention to right and decreased sitting balance, decreased standing balance, decreased postural control, hemiplegia and decreased balance strategies.  Prior to hospitalization, patient was independent  with mobility and lived with Family in a Other(Comment) (town house) home.  Home access is 2 stairs, landing, then 1 thresholdStairs to enter.  Patient will benefit from skilled PT intervention to maximize safe functional mobility and minimize fall risk for planned discharge home with intermittent supervision.  Anticipate patient will benefit from follow up [place] at discharge.  PT - End of Session Activity Tolerance: Endurance does not limit participation in activity Endurance Deficit: No PT Assessment Rehab Potential: Excellent Barriers to Discharge: Decreased caregiver support;Other (comment) Barriers to Discharge Comments: Per daughter, pt at home alone for majority of 8 am-5 pm work day PT Patient demonstrates impairments in the following area(s): Balance;Motor;Perception;Safety;Sensory PT Transfers Functional Problem(s): Bed Mobility;Bed to Chair;Car;Furniture;Floor PT Locomotion Functional Problem(s): Ambulation;Wheelchair Mobility;Stairs PT Plan PT Intensity: Minimum of 1-2 x/day ,45 to 90 minutes PT Frequency: 5 out of 7 days PT Duration Estimated Length of Stay: 14 days PT Treatment/Interventions: Ambulation/gait  training;Balance/vestibular training;Cognitive remediation/compensation;Disease management/prevention;DME/adaptive equipment instruction;Functional mobility training;Patient/family education;Neuromuscular re-education;Splinting/orthotics;Therapeutic Exercise;Therapeutic Activities;Stair training;UE/LE Strength taining/ROM;UE/LE Coordination  activities;Visual/perceptual remediation/compensation;Wheelchair propulsion/positioning PT Transfers Anticipated Outcome(s): Mod I PT Locomotion Anticipated Outcome(s): Supervision PT Recommendation Recommendations for Other Services: Other (comment) (None at this time) Follow Up Recommendations: Other (comment) (Intermittent supervision; follow up PT TBD) Patient destination: Home Equipment Recommended: To be determined Equipment Details: Per daughter, pt owns no personal assistive devices.  Skilled Therapeutic Intervention Treatment Session 1: PT evaluation performed. See below for detailed findings. Treatment initiated. Session focused on increasing pt independence with functional transfers and w/c mobility. Pt performed stand pivot transfer from w/c>toilet with max A, from toilet>w/c with mod A. In rehab apartment, pt performed multiple functional transfers from w/c<>bed; see below for detailed description of assist/cueing required with transfers and bed mobility. Pt attempted to perform w/c mobility with bilat UE's; however, pt with increased difficulty performing LUE technique. Therefore, modified w/c to hemi height to enable LE propulsion. Session ended in pt room, where pt was left seated in recliner with OT present and all needs within reach.  Treatment Session 2: Pt received seated in recliner; agreeable to therapy. Session focused on gait training and stair negotiation. See below for detailed description of cueing and assist required with gait and stairs. Educated pt and daughter (via telephone) on findings, goals, and plan of care. Oriented pt and  daughter to rehab unit, fall precautions. Pt and daughter verbalized understanding of all education and were in full agreement with plan of care. Departed with pt seated in recliner with bilat LE's elevated, quick release belt in place for safety, and all needs within reach.  PT Evaluation Precautions/Restrictions Precautions Precautions: Fall Precaution Comments: R inattention Restrictions Weight Bearing Restrictions: No General   Vital SignsTherapy Vitals Temp: 98.5 F (36.9 C) Temp Source: Oral Pulse Rate: 66 Resp: 18 BP: 114/64 mmHg Patient Position (if appropriate): Lying Oxygen Therapy SpO2: 99 % O2 Device: None (Room air) Pain Pain Assessment Pain Assessment: No/denies pain Pain Score: 0-No pain Home Living/Prior Functioning Home Living Available Help at Discharge: Family;Available PRN/intermittently;Other (Comment) (Lives with daughter, who works 8-5; son-in-law currently out of work) Type of Home: Other(Comment) (townhome) Home Access: Stairs to enter Technical brewer of Steps: 2 stairs, landing, then 1 threshold Entrance Stairs-Rails: Can reach both (2 rails on initial 2 stairs; no rails on second threshold) Home Layout: Able to live on main level with bedroom/bathroom Alternate Level Stairs-Number of Steps: flight  Lives With: Family Prior Function Level of Independence: Independent with basic ADLs;Independent with homemaking with ambulation;Independent with transfers;Independent with gait  Able to Take Stairs?: Yes Driving: No Vocation: Retired Leisure: Hobbies-yes (Comment) Comments: Per daughter, pt enjoys cleaning home daily while grandkids are at school, daughter at work Cognition Orientation Level: Oriented X4 Sensation Sensation Light Touch: Impaired by gross assessment Light Touch Impaired Details: Impaired RLE;Impaired RUE Additional Comments: Pt reports diminished light touch in RUE/LE as compared with LUE/LE. Coordination Gross Motor  Movements are Fluid and Coordinated: No Fine Motor Movements are Fluid and Coordinated: No Motor  Motor Motor: Hemiplegia  Mobility Bed Mobility Bed Mobility: Supine to Sit;Sit to Supine;Sitting - Scoot to Edge of Bed Supine to Sit: 4: Min assist;HOB flat Supine to Sit Details: Tactile cues for sequencing;Visual cues/gestures for sequencing Sitting - Scoot to Edge of Bed: 5: Supervision Sitting - Scoot to [employer] of Bed Details: Visual cues/gestures for sequencing;Tactile cues for weight shifting Sit to Supine: 5: Supervision;HOB flat Transfers Transfers: Yes Sit to Stand: 4: Min assist;From chair/3-in-1 Sit to Stand Details: Verbal cues for precautions/safety;Tactile cues for weight shifting Sit to  Stand Details (indicate cue type and reason): Tactile cueing at R  knee for weightbearing Stand to Sit: 4: Min assist Stand to Sit Details (indicate cue type and reason): Verbal cues for precautions/safety Stand Pivot Transfers: 3: Mod assist Stand Pivot Transfer Details: Verbal cues for precautions/safety;Manual facilitation for weight shifting;Tactile cues for placement;Tactile cues for posture Stand Pivot Transfer Details (indicate cue type and reason): Required verbal/tactile cueing to release grip on arm ressts to fully stand; manual facilitation of lateral weight shifting; tactile cueing to step with RLE Squat Pivot Transfers: 2: Max Risk manager Details: Tactile cues for sequencing;Manual facilitation for weight shifting;Visual cues/gestures for sequencing;Verbal cues for technique Locomotion  Ambulation Ambulation: Yes Ambulation/Gait Assistance: 4: Min assist;3: Mod assist Ambulation Distance (Feet): 30 Feet Assistive device: Other (Comment) (L rail) Ambulation/Gait Assistance Details: Tactile cues for weight shifting;Verbal cues for gait pattern Ambulation/Gait Assistance Details: Gait x30' with LUE support at hallway hand rail; tactile cueing for lateral weight shift  to L side; verbal cueing for increased RLE step length. Min A for majority of gait trial; mod A to recover from single anterior LOB. Gait Gait: Yes Gait Pattern: Impaired Gait Pattern: Step-to pattern;Decreased step length - right;Decreased weight shift to left;Decreased dorsiflexion - right;Decreased hip/knee flexion - right Stairs / Additional Locomotion Stairs: Yes Stairs Assistance: 3: Mod assist Stairs Assistance Details: Tactile cues for sequencing;Visual cues/gestures for sequencing;Verbal cues for sequencing Stair Management Technique: Two rails;Step to pattern;Forwards Number of Stairs: 5 Architect: Yes Wheelchair Assistance: 4: Advertising account executive Details: Visual cues/gestures for sequencing;Tactile cues for weight beaing Wheelchair Propulsion: Both lower extermities Wheelchair Parts Management: Needs assistance Distance: 30  Trunk/Postural Assessment  Cervical Assessment Cervical Assessment: Within Functional Limits Thoracic Assessment Thoracic Assessment: Within Functional Limits Lumbar Assessment Lumbar Assessment: Within Functional Limits Postural Control Postural Control: Deficits on evaluation Righting Reactions: Ankle strategy delayed; no stepping strategy present with posterior LOB.  Balance Balance Balance Assessed: Yes Dynamic Sitting Balance Dynamic Sitting - Balance Support: Left upper extremity supported Dynamic Sitting - Level of Assistance: 5: Stand by assistance Static Standing Balance Static Standing - Balance Support: Left upper extremity supported Static Standing - Level of Assistance: 4: Min assist;5: Stand by assistance Dynamic Standing Balance Dynamic Standing - Balance Support: Bilateral upper extremity supported;During functional activity Dynamic Standing - Level of Assistance: 4: Min assist;3: Mod assist Extremity Assessment  RLE Assessment RLE Assessment: Exceptions to [facility] RLE Strength RLE  Overall Strength: Deficits RLE Overall Strength Comments: Grossly 3+/5 to 4-/5 R hip, knee, and ankle plantarflexion; 2+/5 ankle dorsiflexion LLE Assessment LLE Assessment: Within Functional Limits  FIM:  FIM - Control and instrumentation engineer Devices: Arm rests Bed/Chair Transfer: 0: Activity did not occur;3: Chair or W/C > Bed: Mod A (lift or lower assist);2: Bed > Chair or W/C: Max A (lift and lower assist);4: Supine > Sit: Min A (steadying Pt. > 75%/lift 1 leg) FIM - Locomotion: Wheelchair Distance: 30 Locomotion: Wheelchair: 1: Travels less than 50 ft with minimal assistance (Pt.>75%) FIM - Locomotion: Ambulation Locomotion: Ambulation Assistive Devices: Other (comment) (L rail) Ambulation/Gait Assistance: 3: Mod assist;4: Min assist Locomotion: Ambulation: 1: Travels less than 50 ft with moderate assistance (Pt: 50 - 74%) FIM - Locomotion: Stairs Locomotion: Scientist, physiological: Hand rail - 2 Locomotion: Stairs: 2: Up and Down 4 - 11 stairs with moderate assistance (Pt: 50 - 74%)   Refer to Care Plan for Long Term Goals  Recommendations for other services: None  Discharge Criteria: Patient will be discharged from PT if patient refuses treatment 3 consecutive times without medical reason, if treatment goals not met, if there is a change in medical status, if patient makes no progress towards goals or if patient is discharged from hospital.  The above assessment, treatment plan, treatment alternatives and goals were discussed and mutually agreed upon: by patient and by family  Stefano Gaul 09/28/2014, 7:41 PM

## 2014-09-28 NOTE — Progress Notes (Signed)
Jean Ward is a 80 y.o. female 10/31/1933 [id number]  Subjective: No new complaints. No new problems. Slept well. Feeling OK. Loop recorder dressing was completely saturated with blood on 10/9  Objective: Vital signs in last 24 hours: Temp:  [98.1 F (36.7 C)-98.8 F (37.1 C)] 98.8 F (37.1 C) (10/10 0606) Pulse Rate:  [65-88] 65 (10/10 0606) Resp:  [18-20] 18 (10/10 0606) BP: (117-148)/(54-70) 117/54 mmHg (10/10 0606) SpO2:  [93 %-98 %] 93 % (10/10 0606) Weight:  [172 lb 6.4 oz (78.2 kg)] 172 lb 6.4 oz (78.2 kg) (10/09 1925) Weight change:  Last BM Date: 09/27/14  Intake/Output from previous day: 10/09 0701 - 10/10 0700 In: 240 [P.O.:240] Out: -  Last cbgs: CBG (last 3)   Recent Labs  09/27/14 1624 09/27/14 2306 09/28/14 0650  GLUCAP 139* 125* 140*     Physical Exam General: No apparent distress   HEENT: not dry Lungs: Normal effort. Lungs clear to auscultation, no crackles or wheezes. Cardiovascular: Regular rate and rhythm, no edema Abdomen: S/NT/ND; BS(+) Musculoskeletal:  unchanged Neurological: No new neurological deficits Wounds: loop recorder placement wound - no bleeding    Skin: clear  Aging changes Mental state: Alert, oriented, cooperative    Lab Results: BMET    Component Value Date/Time   NA 139 09/22/2014 0408   K 3.8 09/22/2014 0408   CL 103 09/22/2014 0408   CO2 26 09/22/2014 0401   GLUCOSE 150* 09/22/2014 0408   BUN 18 09/22/2014 0408   CREATININE 0.80 09/22/2014 0408   CALCIUM 9.3 09/22/2014 0401   GFRNONAA 79* 09/22/2014 0401   GFRAA >90 09/22/2014 0401   CBC    Component Value Date/Time   WBC 5.3 09/22/2014 0401   RBC 4.48 09/22/2014 0401   HGB 14.3 09/22/2014 0408   HCT 42.0 09/22/2014 0408   PLT 149* 09/22/2014 0401   MCV 87.9 09/22/2014 0401   MCH 29.0 09/22/2014 0401   MCHC 33.0 09/22/2014 0401   RDW 13.0 09/22/2014 0401   LYMPHSABS 2.0 09/22/2014 0401   MONOABS 0.5 09/22/2014 0401   EOSABS 0.1 09/22/2014 0401   BASOSABS 0.0  09/22/2014 0401    Studies/Results: Dg Hip Complete Left  09/27/2014   CLINICAL DATA:  Left hip pain post fall 3 days ago. Pain posteriorly with limited range of motion.  EXAM: LEFT HIP - COMPLETE 2+ VIEW  COMPARISON:  None.  FINDINGS: Exam demonstrates mild symmetric degenerative change of the hips. There is no acute fracture or dislocation. There is minimal degenerative change of the spine and symphysis pubis joint.  IMPRESSION: No acute findings.   Electronically Signed   By: Elberta Fortisaniel  Boyle M.D.   On: 09/27/2014 11:55    Medications: I have reviewed the patient's current medications.  Assessment/Plan:  1. 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  2. DVT Prophylaxis/Anticoagulation: Pharmaceutical: Lovenox  3. Pain Management: Tylenol or tramadol prn for hip/knee pain.  4. Mood: LCSW to follow for evaluation and support.  5. Neuropsych: This patient is capable of making decisions on her own behalf.  6. Skin/Wound Care: Pressure relief measures.  7. Fluids/Electrolytes/Nutrition: Monitor I/O. Encourage fluid intake.  8. Blood pressure: Monitor every 8 hours.  9. DM type 2: Hgb A1c-6.0. Will monitor with ac/hs checks. Continue trajenta with SSI for elevated BS.  10. Chronic back pain/Left hip contusion--question radiculopathy: Will add Voltaren gel tid to help with symptoms.  11. Loop recorder dressing was completely saturated with blood on 10/9 -  no recurrent bleeding       Length of stay, days: 1  Sonda PrimesAlex Oaklyn Jakubek , MD 09/28/2014, 8:31 AM

## 2014-09-29 ENCOUNTER — Inpatient Hospital Stay (HOSPITAL_COMMUNITY): Payer: Medicare Other | Admitting: Physical Therapy

## 2014-09-29 LAB — GLUCOSE, CAPILLARY
GLUCOSE-CAPILLARY: 129 mg/dL — AB (ref 70–99)
GLUCOSE-CAPILLARY: 100 mg/dL — AB (ref 70–99)
Glucose-Capillary: 119 mg/dL — ABNORMAL HIGH (ref 70–99)

## 2014-09-29 NOTE — Progress Notes (Signed)
Physical Therapy Session Note  Patient Details  Name: Jean Ward MRN: [id number] Date of Birth: 24-Feb-1933  Today's Date: 09/29/2014 PT Individual Time: 0900-1000 PT Individual Time Calculation (min): 60 min   Short Term Goals: Week 1:  PT Short Term Goal 1 (Week 1): Pt will perform supine<>sit with mod I with HOB flat without bed rails. PT Short Term Goal 2 (Week 1): Pt will consistently transfer from bed<>w/c with supervision to min guard with 25% cueing. PT Short Term Goal 3 (Week 1): Pt will ambulate 100' with supervision using LRAD. PT Short Term Goal 4 (Week 1): Pt will negotiate 2 stairs with 2 rails and min A. PT Short Term Goal 5 (Week 1): Pt will negotiate curb step with LRAD and supervision.   Skilled Therapeutic Interventions/Progress Updates:  Pt was seen bedside in the am. Pt up in w/c. Pt propelled w/c about 25 feet with min A and verbal cues for R inattention. Once in rehab gym treatment focused on LE strengthening, balance and NMR. Performed sit to stand transfers 2 sets x 5 reps each with min A. Pt performed cone taps with rolling walker, 3 sets x 10 reps each. Pt ambulated without assistive device 25 feet x 2 with min to mod A and verbal cues for safety. Pt ambulated with rolling walker and min guard to min A 25 feet x 4, verbal cues requires for safety. Pt returned to room. Pt transferred w/c to recliner with min A. Pt left sitting up in recliner with quick release belt in place and call bell.   Therapy Documentation Precautions:  Precautions Precautions: Fall Precaution Comments: R inattention Restrictions Weight Bearing Restrictions: No General:   Pain: No c/o pain.  Locomotion : Ambulation Ambulation/Gait Assistance: 4: Min assist;4: Min guard   See FIM for current functional status  Therapy/Group: Individual Therapy  Rayford HalstedMitchell, [name] 09/29/2014, 12:41 PM

## 2014-09-29 NOTE — Progress Notes (Signed)
Subjective: No new complaints. No new problems. Slept well. Feeling OK. Loop recorder dressing was completely saturated with blood on 10/9 - no relapse of local bleeding over the weekend  Objective: Vital signs in last 24 hours: Temp:  [98.4 F (36.9 C)-98.5 F (36.9 C)] 98.4 F (36.9 C) (10/11 0609) Pulse Rate:  [66-77] 73 (10/11 0609) Resp:  [17-18] 17 (10/11 0609) BP: (114-127)/(47-64) 127/47 mmHg (10/11 0609) SpO2:  [94 %-99 %] 94 % (10/11 0609) Weight change:  Last BM Date: 09/27/14  Intake/Output from previous day: 10/10 0701 - 10/11 0700 In: 720 [P.O.:720] Out: -  Last cbgs: CBG (last 3)   Recent Labs  09/28/14 1645 09/28/14 2103 09/29/14 0650  GLUCAP 126* 129* 129*     Physical Exam General: No apparent distress   HEENT: not dry Lungs: Normal effort. Lungs clear to auscultation, no crackles or wheezes. Cardiovascular: Regular rate and rhythm, no edema Abdomen: S/NT/ND; BS(+) Musculoskeletal:  unchanged Neurological: No new neurological deficits Wounds: loop recorder placement wound - no bleeding    Skin: clear  Aging changes Mental state: Alert, oriented, cooperative    Lab Results: BMET    Component Value Date/Time   NA 139 09/22/2014 0408   K 3.8 09/22/2014 0408   CL 103 09/22/2014 0408   CO2 26 09/22/2014 0401   GLUCOSE 150* 09/22/2014 0408   BUN 18 09/22/2014 0408   CREATININE 0.80 09/22/2014 0408   CALCIUM 9.3 09/22/2014 0401   GFRNONAA 79* 09/22/2014 0401   GFRAA >90 09/22/2014 0401   CBC    Component Value Date/Time   WBC 5.3 09/22/2014 0401   RBC 4.48 09/22/2014 0401   HGB 14.3 09/22/2014 0408   HCT 42.0 09/22/2014 0408   PLT 149* 09/22/2014 0401   MCV 87.9 09/22/2014 0401   MCH 29.0 09/22/2014 0401   MCHC 33.0 09/22/2014 0401   RDW 13.0 09/22/2014 0401   LYMPHSABS 2.0 09/22/2014 0401   MONOABS 0.5 09/22/2014 0401   EOSABS 0.1 09/22/2014 0401   BASOSABS 0.0 09/22/2014 0401    Studies/Results: Dg Hip Complete Left  09/27/2014   CLINICAL DATA:   Left hip pain post fall 3 days ago. Pain posteriorly with limited range of motion.  EXAM: LEFT HIP - COMPLETE 2+ VIEW  COMPARISON:  None.  FINDINGS: Exam demonstrates mild symmetric degenerative change of the hips. There is no acute fracture or dislocation. There is minimal degenerative change of the spine and symphysis pubis joint.  IMPRESSION: No acute findings.   Electronically Signed   By: Elberta Fortisaniel  Boyle M.D.   On: 09/27/2014 11:55    Medications: I have reviewed the patient's current medications.  Assessment/Plan:  1. 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  2. DVT Prophylaxis/Anticoagulation: Pharmaceutical: Lovenox  3. Pain Management: Tylenol or tramadol prn for hip/knee pain.  4. Mood: LCSW to follow for evaluation and support.  5. Neuropsych: This patient is capable of making decisions on her own behalf.  6. Skin/Wound Care: Pressure relief measures.  7. Fluids/Electrolytes/Nutrition: Monitor I/O. Encourage fluid intake.  8. Blood pressure: Monitor every 8 hours.  9. DM type 2: Hgb A1c-6.0. Will monitor with ac/hs checks. Continue trajenta with SSI for elevated BS.  10. Chronic back pain/Left hip contusion--question radiculopathy: Will add Voltaren gel tid to help with symptoms.  11. Loop recorder dressing was completely saturated with blood on 10/9 - no recurrent bleeding over the weekend  Cont w/current Rx      Length of  stay, days: 2  Sonda PrimesAlex Plotnikov , MD 09/29/2014, 8:19 AM

## 2014-09-30 ENCOUNTER — Inpatient Hospital Stay (HOSPITAL_COMMUNITY): Payer: Medicare Other | Admitting: Rehabilitation

## 2014-09-30 ENCOUNTER — Inpatient Hospital Stay (HOSPITAL_COMMUNITY): Payer: Medicare Other | Admitting: Occupational Therapy

## 2014-09-30 LAB — CBC WITH DIFFERENTIAL/PLATELET
BASOS PCT: 1 % (ref 0–1)
Basophils Absolute: 0 10*3/uL (ref 0.0–0.1)
EOS PCT: 2 % (ref 0–5)
Eosinophils Absolute: 0.1 10*3/uL (ref 0.0–0.7)
HEMATOCRIT: 40.3 % (ref 36.0–46.0)
Hemoglobin: 13.6 g/dL (ref 12.0–15.0)
Lymphocytes Relative: 23 % (ref 12–46)
Lymphs Abs: 1.1 10*3/uL (ref 0.7–4.0)
MCH: 29.8 pg (ref 26.0–34.0)
MCHC: 33.7 g/dL (ref 30.0–36.0)
MCV: 88.2 fL (ref 78.0–100.0)
MONO ABS: 0.7 10*3/uL (ref 0.1–1.0)
Monocytes Relative: 14 % — ABNORMAL HIGH (ref 3–12)
Neutro Abs: 2.9 10*3/uL (ref 1.7–7.7)
Neutrophils Relative %: 60 % (ref 43–77)
PLATELETS: 189 10*3/uL (ref 150–400)
RBC: 4.57 MIL/uL (ref 3.87–5.11)
RDW: 13.2 % (ref 11.5–15.5)
WBC: 4.9 10*3/uL (ref 4.0–10.5)

## 2014-09-30 LAB — COMPREHENSIVE METABOLIC PANEL
ALT: 12 U/L (ref 0–35)
AST: 20 U/L (ref 0–37)
Albumin: 3.2 g/dL — ABNORMAL LOW (ref 3.5–5.2)
Alkaline Phosphatase: 53 U/L (ref 39–117)
Anion gap: 14 (ref 5–15)
BUN: 14 mg/dL (ref 6–23)
CALCIUM: 9.3 mg/dL (ref 8.4–10.5)
CO2: 25 meq/L (ref 19–32)
CREATININE: 0.66 mg/dL (ref 0.50–1.10)
Chloride: 103 mEq/L (ref 96–112)
GFR calc Af Amer: >90 mL/min (ref >90)
GFR, EST NON AFRICAN AMERICAN: 82 mL/min — AB (ref >90)
Glucose, Bld: 116 mg/dL — ABNORMAL HIGH (ref 70–99)
Potassium: 4.2 mEq/L (ref 3.7–5.3)
Sodium: 142 mEq/L (ref 137–147)
Total Bilirubin: 0.8 mg/dL (ref 0.3–1.2)
Total Protein: 6.9 g/dL (ref 6.0–8.3)

## 2014-09-30 LAB — GLUCOSE, CAPILLARY
GLUCOSE-CAPILLARY: 121 mg/dL — AB (ref 70–99)
GLUCOSE-CAPILLARY: 114 mg/dL — AB (ref 70–99)
Glucose-Capillary: 99 mg/dL (ref 70–99)
Glucose-Capillary: 107 mg/dL — ABNORMAL HIGH (ref 70–99)
Glucose-Capillary: 134 mg/dL — ABNORMAL HIGH (ref 70–99)

## 2014-09-30 MED ORDER — CAPSAICIN 0.075 % EX CREA
TOPICAL_CREAM | Freq: Two times a day (BID) | CUTANEOUS | Status: DC | PRN
  Filled 2014-09-30: qty 60

## 2014-09-30 MED ORDER — MUSCLE RUB 10-15 % EX CREA
TOPICAL_CREAM | Freq: Two times a day (BID) | CUTANEOUS | Status: DC | PRN
  Administered 2014-10-03 – 2014-10-09 (×2): via TOPICAL
  Filled 2014-09-30: qty 85

## 2014-09-30 NOTE — Progress Notes (Signed)
Recreational Therapy Assessment and Plan  Patient Details  Name: Jean Ward MRN: [id number] Date of Birth: 1933-09-08 Today's Date: 09/30/2014  Rehab Potential: Good ELOS: 10 days   Assessment Clinical Impression:Problem List:  Patient Active Problem List    Diagnosis  Date Noted   .  CVA (cerebral infarction)  09/22/2014   .  Diabetes mellitus type 2, controlled, without complications  67/20/9470    Past Medical History:  Past Medical History   Diagnosis  Date   .  Diabetes mellitus without complication    .  Hypercholesteremia     Past Surgical History:  Past Surgical History   Procedure  Laterality  Date   .  Hiatal hernia repair     .  Tee without cardioversion  N/A  09/24/2014     Procedure: TRANSESOPHAGEAL ECHOCARDIOGRAM (TEE); Surgeon: Dorothy Spark, MD; Location: [facility] ENDOSCOPY; Service: Cardiovascular; Laterality: N/A;    Assessment & Plan  Clinical Impression: Jean Ward is a 80 y.o. RH- female ([place] descent) with history of DM type 2, hyperlipidemia who was admitted on 78/04/15 with slurred speech, right sided weakness and right facial droop. CT head negative for abnormality and she was treated with tPA with improvements. MRI/MRA brain done revealing widespread areas of restricted diffusion are seen throughout the brain, most notable in the LEFT paramedian pons, consistent with multiple areas of acute infarction question emboli. 2D echo with EF 55-60% and no wall abnormality. Carotid dopplers without significant ICA stenosis. ST evaluation done revealing mild dysarthria and intact cognitive linguistic function. TEE without thrombus but positive bubble study and severe non-mobile plaque in descending thoracic aorta and distal portion of aortic arch. BLE dopplers done and negative for DVT. On 10/07, she had worsening of right sided weakness with increase in pain and numbness RLE as well as decrease in vision right eye. Loop recorder placed by Dr. Rayann Heman on 78/09.  She has continued to complain of left hip pain and X rays done revealing mild degenerative changes and no fracture. She continues to have balance deficits with decreased coordination, poor safety and RUE inattention. Patient transferred to CIR on 09/27/2014.   Pt presents with decreased activity tolerance, decreased functional mobility, decreased balance, right inattention Limiting pt's independence with leisure/community pursuits.   Leisure History/Participation Premorbid leisure interest/current participation: Medical laboratory scientific officer - Psychologist, forensic Other Leisure Interests: Television;Cooking/Baking;Housework Leisure Participation Style: Alone;With Family/Friends Awareness of Community Resources: Fair-identify 2 post discharge leisure resources Psychosocial / Spiritual Social interaction - Mood/Behavior: Cooperative Academic librarian Appropriate for Education?: Yes Strengths/Weaknesses Patient Strengths/Abilities: Willingness to participate Patient weaknesses: Physical limitations TR Patient demonstrates impairments in the following area(s): Edema;Endurance;Motor;Pain;Safety  Plan Rec Therapy Plan Is patient appropriate for Therapeutic Recreation?: Yes Rehab Potential: Good Treatment times per week: Min 1 time per week >20 minutes Estimated Length of Stay: 10 days TR Treatment/Interventions: Adaptive equipment instruction;1:1 session;Balance/vestibular training;Functional mobility training;Community reintegration;Patient/family education;Therapeutic activities;Recreation/leisure participation;Therapeutic exercise  Recommendations for other services: None  Discharge Criteria: Patient will be discharged from TR if patient refuses treatment 3 consecutive times without medical reason.  If treatment goals not met, if there is a change in medical status, if patient makes no progress towards goals or if patient is discharged from hospital.  The above assessment, treatment  plan, treatment alternatives and goals were discussed and mutually agreed upon: patient.  Jean Ward 09/30/2014, 3:44 PM

## 2014-09-30 NOTE — Progress Notes (Signed)
Occupational Therapy Session Note  Patient Details  Name: Quin Hooporma Parmelee MRN: [id number] Date of Birth: [birth year]  Today's Date: 09/30/2014 OT Individual Time: 0800-0900 OT Individual Time Calculation (min): 60 min   Short Term Goals: Week 1:  OT Short Term Goal 1 (Week 1): Pt will perform tub transfer onto TTB with Min A in order to increase I with functional transfers. OT Short Term Goal 2 (Week 1): Pt will perform LB dressing with Min A in order to increase I in self care. OT Short Term Goal 3 (Week 1): Pt will perform bathing with Min A in shower in order to increase I with self care. OT Short Term Goal 4 (Week 1): Pt will perform UB dressing with set up A in order to increase I in self care OT Short Term Goal 5 (Week 1): Pt will perform toilet transfer onto standard height toilet with min A in order to increase I in functional transfers.  Skilled Therapeutic Interventions/Progress Updates:  Patient resting in bed upon arrival.  Engaged in self care retraining to include toilet transfer, toileting, shower, dress and groom.  Focused session on activity tolerance, adaptive techniques, coordination and attention to RUE, sit><stand, standing balance, attention to LLE in standing secondary to decreased sustained muscle activity yet able to self correct with reminders by tapping on right thigh.  Patient completed stand step transfers bed>w/c>commode>w/c>tub bench in shower>w/c.  After dressing in bathroom, patient ambulated with RW to sink to perform grooming while standing.  Therapy Documentation Precautions:  Precautions Precautions: Fall Precaution Comments: R inattention Restrictions Weight Bearing Restrictions: No Pain: Bilateral upper shoulder pain, not rated, Voltaren Cream applied, rest and repositioned, RN aware. ADL: See FIM for current functional status  Therapy/Group: Individual Therapy  Rayvon Dakin 09/30/2014, 9:12 AM

## 2014-09-30 NOTE — IPOC Note (Addendum)
Overall Plan of Care [facility](IPOC) Patient Details Name: Quin Hooporma Vanacker MRN: [id number] DOB: 11-27-1933  Admitting Diagnosis: stroke cva  Hospital Problems: Active Problems:   CVA (cerebral infarction)     Functional Problem List: Nursing Medication Management;Motor;Endurance;Pain;Safety;Skin Integrity  PT Balance;Motor;Perception;Safety;Sensory  OT Balance;Motor;Pain;Safety;Sensory  SLP    TR Activity tolerance, functional mobility, balance,  safety, pain       Basic ADL's: OT Grooming;Bathing;Dressing;Toileting     Advanced  ADL's: OT Simple Meal Preparation;Laundry     Transfers: PT Bed Mobility;Bed to Chair;Car;Furniture;Floor  OT Toilet;Tub/Shower     Locomotion: PT Ambulation;Wheelchair Mobility;Stairs     Additional Impairments: OT Other (comment) (R UE strength and coordination)  SLP        TR      Anticipated Outcomes Item Anticipated Outcome  Self Feeding n/a  Swallowing      Basic self-care  Mod I  Toileting  Mod I    Bathroom Transfers toilet - Mod I , TTB - supervision  Bowel/Bladder  Bowel and bladder with minimal assist  Transfers  Mod I  Locomotion  Supervision  Communication     Cognition     Pain  Pain level 3 or less on a scale of 0-10.  Safety/Judgment  Safety/judgement with minimal assist.   Therapy Plan: PT Intensity: Minimum of 1-2 x/day ,45 to 90 minutes PT Frequency: 5 out of 7 days PT Duration Estimated Length of Stay: 14 days OT Intensity: Minimum of 1-2 x/day, 45 to 90 minutes OT Frequency: 5 out of 7 days OT Duration/Estimated Length of Stay: 14 days TR Duration/ELOS:  10 Days TR Frequency:  Min 1 time per week >20 minutes           Team Interventions: Nursing Interventions Patient/Family Education;Disease Management/Prevention;Pain Management;Medication Management;Discharge Planning;Psychosocial Support;Skin Care/Wound Management  PT interventions Ambulation/gait training;Balance/vestibular training;Cognitive  remediation/compensation;Disease management/prevention;DME/adaptive equipment instruction;Functional mobility training;Patient/family education;Neuromuscular re-education;Splinting/orthotics;Therapeutic Exercise;Therapeutic Activities;Stair training;UE/LE Strength taining/ROM;UE/LE Coordination activities;Visual/perceptual remediation/compensation;Wheelchair propulsion/positioning  OT Interventions Balance/vestibular training;Community reintegration;Neuromuscular re-education;Patient/family education;Self Care/advanced ADL retraining;Therapeutic Exercise;UE/LE Coordination activities;Wheelchair propulsion/positioning;UE/LE Strength taining/ROM;Therapeutic Activities;Psychosocial support;Pain management;Functional mobility training;DME/adaptive equipment instruction;Discharge planning  SLP Interventions    TR Interventions   Recreation/leisure participation, Balance/Vestibular training, functional mobility, therapeutic activities, UE/LE strength/coordination, w/c mobility, community reintegration, pt/family education, adaptive equipment instruction/use, discharge planning, psychosocial support   SW/CM Interventions      Team Discharge Planning: Destination: PT-Home ,OT- Home , SLP-  Projected Follow-up: PT-Other (comment) (Intermittent supervision; follow up PT TBD), OT-  Other (comment) (follow up recommended - TBD), SLP-  Projected Equipment Needs: PT-To be determined, OT- Tub/shower bench, SLP-  Equipment Details: PT-Per daughter, pt owns no personal assistive devices., OT-  Patient/family involved in discharge planning: PT- Patient;Family member/caregiver,  OT-Patient, SLP-   MD ELOS: 10-12 d Medical Rehab Prognosis:  Good Assessment: [name] with history of DM type 2, hyperlipidemia who was admitted on 09/22/14 with slurred speech, right sided weakness and right facial droop. CT head negative for abnormality and she was treated with tPA with improvements. MRI/MRA  brain done revealing widespread areas of restricted diffusion are seen throughout the brain, most notable in the LEFT paramedian pons, consistent with multiple areas of acute infarction question emboli. 2D echo with EF 55-60% and no wall abnormality. Carotid dopplers without significant ICA stenosis. ST evaluation done revealing mild dysarthria and intact cognitive linguistic function. TEE without thrombus but positive bubble study and severe non-mobile plaque in descending thoracic aorta and distal portion of aortic arch. BLE dopplers done and  negative for DVT. On 10/07, she had worsening of right sided weakness with increase in pain and numbness RLE as well as decrease in vision right eye. Loop recorder placed by Dr. Johney FrameAllred on 10/09.  Now requiring 24/7 Rehab RN,MD, as well as CIR level PT, OT and SLP.  Treatment team will focus on ADLs and mobility with goals set at Mod I   See Team Conference Notes for weekly updates to the plan of care

## 2014-09-30 NOTE — Progress Notes (Signed)
Social Work [name], female   DOB: 08-19-1933, 80 y.o.   MRN: [id number] Interpreter arranged for M-F 8;00-12;00 mix up over the weekend and today.

## 2014-09-30 NOTE — Discharge Summary (Signed)
I have personally examined this patient, and the plan of care and participated and medical decision making and agree with the above

## 2014-09-30 NOTE — Progress Notes (Signed)
Social Work Assessment and Plan Social Work Assessment and Plan  Patient Details  Name: Jean Ward MRN: [id number] Date of Birth: 11-Apr-1933  Today's Date: 09/30/2014  Problem List:  Patient Active Problem List   Diagnosis Date Noted  . CVA (cerebral infarction) 09/22/2014  . Diabetes mellitus type 2, controlled, without complications 09/22/2014   Past Medical History:  Past Medical History  Diagnosis Date  . Diabetes mellitus without complication   . Hypercholesteremia    Past Surgical History:  Past Surgical History  Procedure Laterality Date  . Hiatal hernia repair    . Tee without cardioversion N/A 09/24/2014    Procedure: TRANSESOPHAGEAL ECHOCARDIOGRAM (TEE);  Surgeon: Lars MassonKatarina H Nelson, MD;  [facility];  Service: Cardiovascular;  Laterality: N/A;   Social History:  reports that she has never smoked. She does not have any smokeless tobacco history on file. Her alcohol and drug histories are not on file.  Family / Support Systems Marital Status: Widow/Widower Patient Roles: Parent Children: Evelyn-[phone] Other Supports: Other family members Anticipated Caregiver: Trying to come up with a plan Ability/Limitations of Caregiver: Renea Eevelyn works and needs to since she is the only income earner in her family Caregiver Availability: Other (Comment) (Discussing a plan with family) Family Dynamics: Close knit family who are trying to see who can provide the care pt requires.  She has family in [place].  The daughter she lives with works and needs to.  Informed she will need supervision when she leaves here.  Pt has been here for 40 years.  Social History Preferred language: Spanish Religion:  Cultural Background: originally from [place]has been here in [place] years.  Doesn't speak English but understands more than she can speak.  Needs an interpreter while here. Education: [employer] Read: Yes (Native language) Write: Yes (Native language) Employment  Status: Retired Fish farm managerLegal Hisotry/Current Legal Issues: No issues Guardian/Conservator: None-according to MD pt is capable of making her own decisions while here.  She has no speech deficits   Abuse/Neglect Physical Abuse: Denies Verbal Abuse: Denies Sexual Abuse: Denies Exploitation of patient/patient's resources: Denies Self-Neglect: Denies  Emotional Status Pt's affect, behavior adn adjustment status: Pt is motivated to improve and wants to get back to her independent level.  She was independent and helped with home management for daughter and graqnddaughter's.  She has never driven daughter does this.  She is anxious at times due to the language barrier. Recent Psychosocial Issues: Other health issues and language issues Pyschiatric History: No history deferred depression due to daughter and pt feels she is doing ok with all of this and is encouraged by her progress she has made already. Substance Abuse History: No issues  Patient / Family Perceptions, Expectations & Goals Pt/Family understanding of illness & functional limitations: Pt and daughter can explain her stroke and deficits.  Daughter is hopeful she will do well and get as much function back as she can while on rehab.  She has been made aware of the team's recommendation fo 24 hr supervision level.  They currently do not have a plan for this. Premorbid pt/family roles/activities: Mother, Grandmother, retiree, etc Anticipated changes in roles/activities/participation: resume Pt/family expectations/goals: Pt states: " I want to do good here."  Daughter states; " I hope she can get back to where she was before this."  [employer] Community Agencies: None Premorbid Home Care/DME Agencies: None Transportation available at discharge: Daughter Resource referrals recommended: Support group (specify) (CVA SUpport Group)  Discharge Planning Living Arrangements: Children;Other relatives  Support Systems: Children;Other  relatives;Friends/neighbors Type of Residence: Private residence Insurance Resources: Medicare;Medicaid (specify county) Medical sales representative([place]) Financial Resources: Tree surgeonocial Security;Family Support Financial Screen Referred: No Living Expenses: Lives with family Money Management: Family Does the patient have any problems obtaining your medications?: No Home Management: Daughter and pt Patient/Family Preliminary Plans: Family coming up with a discharge plan.  Informed the team's goals are supervision level, so when she goes home someone needs to be there with her.  Daughter works during the day and no one is there with pt, so family to come up with a plan for pt.  Aware team conference Wed. Social Work Anticipated Follow Up Needs: HH/OP;Support Group  Clinical Impression Pleasant female who is very cooperative, supportive family who were hoping she would reach mod/i level while here.  Daughter to discuss with sister the plan for home and await team's conference Wed. Have arranged an interpreter for therapies this week.  Work on a safe discharge plan.  Lucy Chrisupree, Sharelle Burditt G 09/30/2014, 11:24 AM

## 2014-09-30 NOTE — Progress Notes (Signed)
Patient information reviewed and entered into eRehab system by Tryston Gilliam, RN, CRRN, PPS Coordinator.  Information including medical coding and functional independence measure will be reviewed and updated through discharge.     Per nursing patient was given "Data Collection Information Summary for Patients in Inpatient Rehabilitation Facilities with attached "Privacy Act Statement-Health Care Records" upon admission.  

## 2014-09-30 NOTE — Progress Notes (Signed)
Physical Therapy Session Note  Patient Details  Name: Jean Ward MRN: [id number] Date of Birth: [birth year]  Today's Date: 09/30/2014 PT Individual Time: 0900-1000 and 1100-1200 PT Individual Time Calculation (min): 60 min and 60 mins  Short Term Goals: Week 1:  PT Short Term Goal 1 (Week 1): Pt will perform supine<>sit with mod I with HOB flat without bed rails. PT Short Term Goal 2 (Week 1): Pt will consistently transfer from bed<>w/c with supervision to min guard with 25% cueing. PT Short Term Goal 3 (Week 1): Pt will ambulate 100' with supervision using LRAD. PT Short Term Goal 4 (Week 1): Pt will negotiate 2 stairs with 2 rails and min A. PT Short Term Goal 5 (Week 1): Pt will negotiate curb step with LRAD and supervision.   Skilled Therapeutic Interventions/Progress Updates:   First AM session:  Pt received sitting in recliner in room, having just finished OT session, but agreeable to PT session.  No interpreter present during session, therefore utilized hand gestures and Environmental managergoogle translator on therapist phone.  Skilled session focused on gait training with use of RW, as well as NMR for RLE/UE with coordination activities and standing activities/gait without device.  Performed w/c mobility x 100' to therapy gym using BLEs/UEs.  Pt with increased difficulty coordinating RUE and LE activity, therefore had her place UEs into lap and provided [facility] assist for moving LEs in alternating manner.  Once in therapy gym, worked on gait training x 65' x 2 without AD in order to increase NMR/weight bearing through RLE.  Requires min/mod A without AD with tactile and min verbal cues (limited due to language barrier) for upright posture, increased glute/quad activity during R stance and also for larger steps on LLE.  Progressed to performing gait with shopping cart to decrease UE support while also scanning environment for horseshoes.  Pt demonstrates R inattention and requires max verbal and tactile cues  for scanning R environment.  Also performed stepping to 6" step with B UE support, single UE support, and no UE with min to mod A, esp without UE support.  Mirror in front of pt for visual support.  Ended session with gait with RW x 60' x 1 and another 100' x 1 at min A with min cues for larger steps, tactile cues and assist for maintaining position inside of RW and for upright posture.  Max cues when turning to sit for keeping RW with her.  Pt left in recliner in room with quick release belt donned and all needs in reach.   Second AM session:  Pt received sitting in recliner, asleep but was easily aroused with voice and light touch.  Pt motioning that she needed to use restroom prior to session.  Pt ambulated to/from restroom with RW at min A level with mod/max cues for maintaining position inside of RW and safety when standing to adjust clothing.  Pt requires some assist for adjusting clothing prior to and following, however she does attempt to assist with RUE/LE with min A for standing balance.  Pt then propelled to therapy gym x 100' with BLEs in order to continue to work on coordination.  Performed seated nustep x 8 mins at level 3-4 with BUE/LEs to increase overall strengthening, attention to R side and NMR through RLE.  Pt tolerated well without rest breaks.  Progressed to more gait without AD as mentioned above.  Pt continues to require cues for upright posture, increased L step length and safety when turning to  sit.  Progressed to standing with LLE on small 2" step with ace wrap around R knee to control pain (unsure if joint pain from hyperextension or not).  Performed x 5 reps at min A level.  RT joined session for last 15 mins in order to ask about what pt enjoyed doing.  Pt states working around the house and watching TV (per [employer] translator).  Discussed working on this with her during sessions.  Ended session with tall kneeling activity with reaching R and L to increase weight shift/WB through RLE  and active use of RUE during reaching.  Pt with questions regarding progress stating, "are you going to heal me."  Discussed that all of her therapies are to increase her independence and strengthening so that she will be safe at home.  Pt verbalized understanding.  Pt transferred back to w/c with mod A for turning and assisted back to room.  Left in recliner with quick release belt donned and daughter and another family member in room.  All needs in reach.   Therapy Documentation Precautions:  Precautions Precautions: Fall Precaution Comments: R inattention Restrictions Weight Bearing Restrictions: No   Vital Signs: Therapy Vitals Temp: 98.4 F (36.9 C) Temp Source: Oral Pulse Rate: 96 Resp: 18 BP: 135/52 mmHg Patient Position (if appropriate): Lying Oxygen Therapy SpO2: 96 % O2 Device: None (Room air) Pain:  Pain in upper shoulders and pain in back of R knee with gait.  Provided ace wrap to R knee during mobility to assist.    See FIM for current functional status  Therapy/Group: Individual Therapy  Vista Deckarcell, Fox Salminen Ann 09/30/2014, 8:56 AM

## 2014-09-30 NOTE — Care Management Note (Signed)
Inpatient Rehabilitation Center Individual Statement of Services  Patient Name:  Jean Ward  Date:  09/30/2014  Welcome to the Inpatient Rehabilitation Center.  Our goal is to provide you with an individualized program based on your diagnosis and situation, designed to meet your specific needs.  With this comprehensive rehabilitation program, you will be expected to participate in at least 3 hours of rehabilitation therapies Monday-Friday, with modified therapy programming on the weekends.  Your rehabilitation program will include the following services:  Physical Therapy (PT), Occupational Therapy (OT), 24 hour per day rehabilitation nursing, Case Management (Social Worker), Rehabilitation Medicine, Nutrition Services and Pharmacy Services  Weekly team conferences will be held on Wednesday to discuss your progress.  Your Social Worker will talk with you frequently to get your input and to update you on team discussions.  Team conferences with you and your family in attendance may also be held.  Expected length of stay: 14 days  Overall anticipated outcome: supervision with cues  Depending on your progress and recovery, your program may change. Your Social Worker will coordinate services and will keep you informed of any changes. Your Social Worker's name and contact numbers are listed  below.  The following services may also be recommended but are not provided by the Inpatient Rehabilitation Center:   Home Health Rehabiltiation Services  Outpatient Rehabilitation Services    Arrangements will be made to provide these services after discharge if needed.  Arrangements include referral to agencies that provide these services.  Your insurance has been verified to be:  Medicare & medicaid Your primary doctor is:  Dr Kirtland BouchardK at [facility]  Pertinent information will be shared with your doctor and your insurance company.  Social Worker:  Dossie DerBecky Bali Lyn, SW [phone] or ([phone])  [phone]  Information discussed with and copy given to patient by: Lucy Chrisupree, Strummer Canipe G, 09/30/2014, 10:18 AM

## 2014-09-30 NOTE — Progress Notes (Addendum)
Subjective/Complaints: [name] with history of DM type 2, hyperlipidemia who was admitted on 09/22/14 with slurred speech, right sided weakness and right facial droop. CT head negative for abnormality and she was treated with tPA with improvements. MRI/MRA brain done revealing widespread areas of restricted diffusion are seen throughout the brain, most notable in the LEFT paramedian pons, consistent with multiple areas of acute infarction question emboli. 2D echo with EF 55-60% and no wall abnormality. Carotid dopplers without significant ICA stenosis. ST evaluation done revealing mild dysarthria and intact cognitive linguistic function. TEE without thrombus but positive bubble study and severe non-mobile plaque in descending thoracic aorta and distal portion of aortic arch. BLE dopplers done and negative for DVT. On 10/07, she had worsening of right sided weakness with increase in pain and numbness RLE as well as decrease in vision right eye. Loop recorder placed by Dr. Johney FrameAllred on 10/09. She has continued to complain of left hip pain and X rays done revealing mild degenerative changes and no fracture  Neck is sore No falls noted  Difficult to get ROS secondary to language Objective: Vital Signs: Blood pressure 135/52, pulse 96, temperature 98.4 F (36.9 C), temperature source Oral, resp. rate 18, weight 78.2 kg (172 lb 6.4 oz), SpO2 96.00%. No results found. Results for orders placed during the hospital encounter of 09/27/14 (from the past 72 hour(s))  GLUCOSE, CAPILLARY     Status: Abnormal   Collection Time    09/27/14 11:06 PM      Result Value Ref Range   Glucose-Capillary 125 (*) 70 - 99 mg/dL  GLUCOSE, CAPILLARY     Status: Abnormal   Collection Time    09/28/14  6:50 AM      Result Value Ref Range   Glucose-Capillary 140 (*) 70 - 99 mg/dL  GLUCOSE, CAPILLARY     Status: Abnormal   Collection Time    09/28/14 11:40 AM      Result Value Ref Range    Glucose-Capillary 155 (*) 70 - 99 mg/dL   Comment 1 Notify RN    GLUCOSE, CAPILLARY     Status: Abnormal   Collection Time    09/28/14  4:45 PM      Result Value Ref Range   Glucose-Capillary 126 (*) 70 - 99 mg/dL   Comment 1 Notify RN    GLUCOSE, CAPILLARY     Status: Abnormal   Collection Time    09/28/14  9:03 PM      Result Value Ref Range   Glucose-Capillary 129 (*) 70 - 99 mg/dL  GLUCOSE, CAPILLARY     Status: Abnormal   Collection Time    09/29/14  6:50 AM      Result Value Ref Range   Glucose-Capillary 129 (*) 70 - 99 mg/dL  GLUCOSE, CAPILLARY     Status: Abnormal   Collection Time    09/29/14 11:36 AM      Result Value Ref Range   Glucose-Capillary 100 (*) 70 - 99 mg/dL   Comment 1 Notify RN    GLUCOSE, CAPILLARY     Status: Abnormal   Collection Time    09/29/14  4:31 PM      Result Value Ref Range   Glucose-Capillary 119 (*) 70 - 99 mg/dL  GLUCOSE, CAPILLARY     Status: Abnormal   Collection Time    09/29/14  9:35 PM      Result Value Ref Range   Glucose-Capillary 121 (*)  70 - 99 mg/dL   Comment 1 Notify RN    CBC WITH DIFFERENTIAL     Status: Abnormal   Collection Time    09/30/14  5:46 AM      Result Value Ref Range   WBC 4.9  4.0 - 10.5 K/uL   RBC 4.57  3.87 - 5.11 MIL/uL   Hemoglobin 13.6  12.0 - 15.0 g/dL   HCT 16.140.3  09.636.0 - 04.546.0 %   MCV 88.2  78.0 - 100.0 fL   MCH 29.8  26.0 - 34.0 pg   MCHC 33.7  30.0 - 36.0 g/dL   RDW 40.913.2  81.111.5 - 91.415.5 %   Platelets 189  150 - 400 K/uL   Neutrophils Relative % 60  43 - 77 %   Neutro Abs 2.9  1.7 - 7.7 K/uL   Lymphocytes Relative 23  12 - 46 %   Lymphs Abs 1.1  0.7 - 4.0 K/uL   Monocytes Relative 14 (*) 3 - 12 %   Monocytes Absolute 0.7  0.1 - 1.0 K/uL   Eosinophils Relative 2  0 - 5 %   Eosinophils Absolute 0.1  0.0 - 0.7 K/uL   Basophils Relative 1  0 - 1 %   Basophils Absolute 0.0  0.0 - 0.1 K/uL     HEENT: normal Cardio: RRR and no murmur Resp: CTA B/L and unlabored GI: BS positive and  NT,ND Extremity:  Pulses positive and No Edema Skin:   Intact and Loop recorder site without active bleeding + ecchymosis Neuro: Cranial Nerve II-XII normal, Abnormal Motor 4/5 R UE, 5/5 in LUE and BLE and Abnormal FMC Ataxic/ dec FMC Musc/Skel:  Neck tender Gen NAD   Assessment/Plan: 1. 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 which require 3+ hours per day of interdisciplinary therapy in a comprehensive inpatient rehab setting. Physiatrist is providing close team supervision and 24 hour management of active medical problems listed below. Physiatrist and rehab team continue to assess barriers to discharge/monitor patient progress toward functional and medical goals. FIM: FIM - Bathing Bathing Steps Patient Completed: Chest;Right Arm;Left Arm;Abdomen;Front perineal area;Buttocks;Left upper leg;Right upper leg Bathing: 4: Steadying assist  FIM - Upper Body Dressing/Undressing Upper body dressing/undressing steps patient completed: Thread/unthread left sleeve of pullover shirt/dress;Thread/unthread right sleeve of pullover shirt/dresss Upper body dressing/undressing: 3: Mod-Patient completed 50-74% of tasks FIM - Lower Body Dressing/Undressing Lower body dressing/undressing steps patient completed: Pull pants up/down;Pull underwear up/down Lower body dressing/undressing: 2: Max-Patient completed 25-49% of tasks  FIM - Toileting Toileting steps completed by patient: Performs perineal hygiene Toileting Assistive Devices: Grab bar or rail for support Toileting: 2: Max-Patient completed 1 of 3 steps  FIM - Diplomatic Services operational officerToilet Transfers Toilet Transfers Assistive Devices: Grab bars Toilet Transfers: 3-To toilet/BSC: Mod A (lift or lower assist);3-From toilet/BSC: Mod A (lift or lower assist)  FIM - Bed/Chair Transfer Bed/Chair Transfer Assistive Devices: Arm rests Bed/Chair Transfer: 3: Bed > Chair or W/C: Mod A (lift or lower assist);4: Sit > Supine:  Min A (steadying pt. > 75%/lift 1 leg)  FIM - Locomotion: Wheelchair Distance: 30 Locomotion: Wheelchair: 1: Travels less than 50 ft with minimal assistance (Pt.>75%) FIM - Locomotion: Ambulation Locomotion: Ambulation Assistive Devices: Designer, industrial/productWalker - Rolling Ambulation/Gait Assistance: 4: Min assist;4: Min guard Locomotion: Ambulation: 1: Travels less than 50 ft with minimal assistance (Pt.>75%)  Comprehension Comprehension Mode: Auditory Comprehension: 5-Follows basic conversation/direction: With no assist  Expression Expression Mode: Verbal Expression: 5-Expresses basic needs/ideas: With  no assist  Social Interaction Social Interaction: 5-Interacts appropriately 90% of the time - Needs monitoring or encouragement for participation or interaction.  Problem Solving Problem Solving: 5-Solves basic problems: With no assist  Memory Memory: 5-Recognizes or recalls 90% of the time/requires cueing < 10% of the time  Medical Problem List and Plan:  1. 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  2. DVT Prophylaxis/Anticoagulation: Pharmaceutical: Lovenox  3. Pain Management: Tylenol or tramadol prn for hip/knee pain.  4. Mood: LCSW to follow for evaluation and support.  5. Neuropsych: This patient is capable of making decisions on her own behalf.  6. Skin/Wound Care: Pressure relief measures.  7. Fluids/Electrolytes/Nutrition: Monitor I/O. Encourage fluid intake.  8. Blood pressure: Monitor every 8 hours.  9. DM type 2: Hgb A1c-6.0. Will monitor with ac/hs checks. Continue trajenta with SSI for elevated BS.  10. Chronic back pain/Left hip contusion--question radiculopathy: Will add Voltaren gel tid to help with symptoms.  11.  Neck pain add kpad and analgesic cream  LOS (Days) 3 A FACE TO FACE EVALUATION WAS PERFORMED  Jean Ward 09/30/2014, 6:40 AM

## 2014-10-01 ENCOUNTER — Inpatient Hospital Stay (HOSPITAL_COMMUNITY): Payer: Medicare Other | Admitting: *Deleted

## 2014-10-01 ENCOUNTER — Inpatient Hospital Stay (HOSPITAL_COMMUNITY): Payer: Medicare Other | Admitting: Physical Therapy

## 2014-10-01 ENCOUNTER — Inpatient Hospital Stay (HOSPITAL_COMMUNITY): Payer: Medicare Other | Admitting: Occupational Therapy

## 2014-10-01 LAB — GLUCOSE, CAPILLARY
GLUCOSE-CAPILLARY: 146 mg/dL — AB (ref 70–99)
Glucose-Capillary: 114 mg/dL — ABNORMAL HIGH (ref 70–99)
Glucose-Capillary: 118 mg/dL — ABNORMAL HIGH (ref 70–99)
Glucose-Capillary: 121 mg/dL — ABNORMAL HIGH (ref 70–99)

## 2014-10-01 NOTE — Progress Notes (Signed)
Occupational Therapy Session Note  Patient Details  Name: Jean Ward MRN: [id number] Date of Birth: 17-Jan-1933  Today's Date: 10/01/2014 OT Individual Time: 0800-0905 OT Individual Time Calculation (min): 65 min   Short Term Goals: Week 1:  OT Short Term Goal 1 (Week 1): Pt will perform tub transfer onto TTB with Min A in order to increase I with functional transfers. OT Short Term Goal 2 (Week 1): Pt will perform LB dressing with Min A in order to increase I in self care. OT Short Term Goal 3 (Week 1): Pt will perform bathing with Min A in shower in order to increase I with self care. OT Short Term Goal 4 (Week 1): Pt will perform UB dressing with set up A in order to increase I in self care OT Short Term Goal 5 (Week 1): Pt will perform toilet transfer onto standard height toilet with min A in order to increase I in functional transfers.  Skilled Therapeutic Interventions/Progress Updates:  Patient resting in bed upon arrival and interpreter arrived as well. Engaged in self care retraining to include sponge bath at sink and dressing.  Began session with bed mobility and patient required mod assist for techniques and for sidely>sit EOB.  Ambulated with HHA to gather her clothes from chest of drawers and on the way back she became emotional (interpreter stated that patient was worried that she was getting weaker and worse) then the patient reported that the room was spinning.  At this point the patient required total assist to sit EOB so that she would not fall.  RN called into the room.  Vitals were taken and MD requested orthostatic BP which were WNL.  Patient reports that yesterday, her right hand was not doing as well as it has before and hopes she is not getting worse.  RN stated OK to continue B/Dsg session therefore patient performed sponge bath at sink in standing then sat down to begin dressing.  Patient required close supervision to ambulate to sink from bed with RW and while standing  at sink to bathe, patient needed verbal and tactile cues to sustain muscle activity in RLE.  Patient demonstrates decreased attention to right side of body during BADL yet initiates attempts to use RUE for tasks.  Patient required more assistance with dressing this AM.  Near end of session, patient's daughter from [place] arrived asking many questions.  Attempted to address her questions as well as asked her to talk with the SW after team conference in the morning for further answers.  Therapy Documentation Precautions:  Precautions Precautions: Fall Precaution Comments: R inattention Restrictions Weight Bearing Restrictions: No Pain: Denies pain ADL: See FIM for current functional status  Therapy/Group: Individual Therapy  SHAFFER, CHRISTINA 10/01/2014, 2:54 PM

## 2014-10-01 NOTE — Progress Notes (Signed)
Social Work [name], female   DOB: September 06, 1933, 80 y.o.   MRN: [id number] Met with pt, daughter and son in-law from  [place] they are planning to take pt home with them, since they can provide 24 hr care. There are also many more family members there over her one daughter here.  Discussed team conference tomorrow and the need for her to obtain a PCP  So can order home health therapies and follow her medically.  Will discuss with them tomorrow.  They plan to leave to go back and then return when pt discharged.

## 2014-10-01 NOTE — Progress Notes (Signed)
Physical Therapy Session Note  Patient Details  Name: Jean Ward MRN: [id number] Date of Birth: 1933/04/11  Today's Date: 10/01/2014 PT Individual Time: 11:05-12:05 (60min)   Short Term Goals: Week 1:  PT Short Term Goal 1 (Week 1): Pt will perform supine<>sit with mod I with HOB flat without bed rails. PT Short Term Goal 2 (Week 1): Pt will consistently transfer from bed<>w/c with supervision to min guard with 25% cueing. PT Short Term Goal 3 (Week 1): Pt will ambulate 100' with supervision using LRAD. PT Short Term Goal 4 (Week 1): Pt will negotiate 2 stairs with 2 rails and min A. PT Short Term Goal 5 (Week 1): Pt will negotiate curb step with LRAD and supervision.   Skilled Therapeutic Interventions/Progress Updates:  Tx focused on functional mobility training, gait training with/without device, and NMR via forced use, manual facilitation, and multi-modal cues. Pt quite fatigued from full morning of therapy, which was limiting somewhat this tx. Pt educated on CVA recovery and forced-use principles.  ACE wrap applied to R knee for increased proprioception during standing and gait tasks.   NMR performed throughout tx during transitional movements as well as the following:  - Seated R LAQ with 5 sec hold x10 - Seated R marching x20 - Standing at mirror with RUE hand-held support for static stance, mini-squats, marching, and step-taps R/L 2x10 at 4" step with mirror for visual feedback. Pt had most difficulty with L step-taps due to decreased R extensor activation. Tactile cues and manual facilitation for R hip and knee extensors in stance phases; postural cues at chest as well.  Seated rests needed between tasks due to fatigue.  Supine bridging x10 with cues for technique; handout left with family to work on in evenings.   Therapeutic activity Pt propelled WC x50' with bil LEs for coordination training, but limited by fatigue.  Performed multiple sit<>stand transfers with max cues  for looking to R side and using RUE rather than L. Increased challenge by removing LUE from task, but pt needed more assistance with this modification. Min/mod A overall. Manual facilitation also provided at RLE for increased forced use.  Performed sit<>supine transfer with mod A overall via rolling to increase R-sided activation in transitional movement.   Gait training with RW x 125' with Min A overall and verbal cues for R hip/knee ext in stance. Increased challenge without device x65,' needing up to Mod A for balance and weight shifting. Performance improved with practice for R extension in stance. ACE helps prevent genu recurvatum. Pt allowed to use LUE support hand rail x15' with improved posture.   Pt left up EOB with bed alarm on for lunch.     Therapy Documentation Precautions:  Precautions Precautions: Fall Precaution Comments: R inattention Restrictions Weight Bearing Restrictions: No General:   Vital Signs: Therapy Vitals Pulse Rate: 72 BP: 104/72 mmHg Oxygen Therapy SpO2: 95 % Pain: Pain Assessment Pain Assessment: No/denies pain    Locomotion : Ambulation Ambulation/Gait Assistance: 4: Min guard Wheelchair Mobility Distance: 100     Other Treatments:    See FIM for current functional status  Therapy/Group: Individual Therapy Clydene Lamingole Kirtan Sada, PT, DPT  10/01/2014, 11:14 AM

## 2014-10-01 NOTE — Progress Notes (Signed)
Recreational Therapy Session Note  Patient Details  Name: Jean Ward MRN: [id number] Date of Birth: 29-Nov-1933 Today's Date: 10/01/2014  Pain: no c/o Skilled Therapeutic Interventions/Progress Updates: Session spent discussing community reintegration/outing to grocery store with pt, pt's daughter & interpreter.  Pt agreeable to participate & will work with interpreter to create an ingredient list for cooking activity on THursday.  Ahsan Esterline 10/01/2014, 10:50 AM

## 2014-10-01 NOTE — Progress Notes (Signed)
Physical Therapy Session Note  Patient Details  Name: Jean Ward MRN: [id number] Date of Birth: 03/30/1933  Today's Date: 10/01/2014 PT Individual Time: 0935-1040 PT Individual Time Calculation (min): 65 min   Short Term Goals: Week 1:  PT Short Term Goal 1 (Week 1): Pt will perform supine<>sit with mod I with HOB flat without bed rails. PT Short Term Goal 2 (Week 1): Pt will consistently transfer from bed<>w/c with supervision to min guard with 25% cueing. PT Short Term Goal 3 (Week 1): Pt will ambulate 100' with supervision using LRAD. PT Short Term Goal 4 (Week 1): Pt will negotiate 2 stairs with 2 rails and min A. PT Short Term Goal 5 (Week 1): Pt will negotiate curb step with LRAD and supervision.   Skilled Therapeutic Interventions/Progress Updates:   Pt received sitting in w/c[place] interpreter present for session. Session focused on ambulation, R NMR, and attending to R. Daughter reporting no changes in patient's memory but reporting change in behavior and communication style following stroke. Pt with increased difficulty donning R shoe due to RLE swelling, required assist with use of shoe horn to don more appropriate sneaker for therapy. Pt propelled w/c using BLEs x 100 ft with min A for propulsion and verbal cues for technique and attending to obstacles on R. Pt performed gait training using RW x 100 ft with min/guard and mod verbal/tactile cues for taking small steps with RW during turns, keeping RW closer to body, attending to R hand on grip, and steering RW in straight path vs deviating to R secondary to RUE weakness. Pt performed sit <> stand x 10 without UE support on RW and verbal cues for controlling descent with supervision-min A to facilitate anterior weight shift and weight shift to R. Pt requires cues not to pull up on RW throughout remainder of session. Patient completed task to challenge R attention and functional use of RUE by identifying sticky  notes on R wall during ambulation with RW. Pt located 9/10 sticky notes without cues and collected them using RUE with assist as needed at elbow for stability for reaching higher notes. Patient fitted with more appropriate pediatric RW and performed gait x 150 ft with min guard and focus on keeping RW safe distance from body and obstacle negotiation on R, improved gait mechanics noted with pediatric RW due to improved UE/shoulder positioning. Pt negotiated up/down 5 stairs using 2 rails with min-mod A and mod questioning cues for step-to pattern sequencing. Pt required assist x 2 when ascending for placement of RLE on step due to fatigue/inattention. Patient returned to room and left sitting in w/c with family and interpreter present.  Therapy Documentation Precautions:  Precautions Precautions: Fall Precaution Comments: R inattention Restrictions Weight Bearing Restrictions: No Pain: Pain Assessment Pain Assessment: No/denies pain  See FIM for current functional status  Therapy/Group: Individual Therapy  Kerney ElbeVarner, Bach Rocchi A 10/01/2014, 10:51 AM

## 2014-10-01 NOTE — Progress Notes (Signed)
Subjective/Complaints: 80 y.o. RH- female ([place] descent) with history of DM type 2, hyperlipidemia who was admitted on 09/22/14 with slurred speech, right sided weakness and right facial droop. CT head negative for abnormality and she was treated with tPA with improvements. MRI/MRA brain done revealing widespread areas of restricted diffusion are seen throughout the brain, most notable in the LEFT paramedian pons, consistent with multiple areas of acute infarction question emboli. 2D echo with EF 55-60% and no wall abnormality. Carotid dopplers without significant ICA stenosis. ST evaluation done revealing mild dysarthria and intact cognitive linguistic function. TEE without thrombus but positive bubble study and severe non-mobile plaque in descending thoracic aorta and distal portion of aortic arch. BLE dopplers done and negative for DVT. On 10/07, she had worsening of right sided weakness with increase in pain and numbness RLE as well as decrease in vision right eye. Loop recorder placed by Dr. Rayann Heman on 10/09. She has continued to complain of left hip pain and X rays done revealing mild degenerative changes and no fracture  "terapia" Pt has some neck pain, aspercreme ordered yesterday  No pain over loop recorder  Difficult to get ROS secondary to language Objective: Vital Signs: Blood pressure 121/76, pulse 69, temperature 97.9 F (36.6 C), temperature source Oral, resp. rate 18, weight 78.2 kg (172 lb 6.4 oz), SpO2 98.00%. No results found. Results for orders placed during the hospital encounter of 09/27/14 (from the past 72 hour(s))  GLUCOSE, CAPILLARY     Status: Abnormal   Collection Time    09/28/14 11:40 AM      Result Value Ref Range   Glucose-Capillary 155 (*) 70 - 99 mg/dL   Comment 1 Notify RN    GLUCOSE, CAPILLARY     Status: Abnormal   Collection Time    09/28/14  4:45 PM      Result Value Ref Range   Glucose-Capillary 126 (*) 70 - 99 mg/dL   Comment 1 Notify RN     GLUCOSE, CAPILLARY     Status: Abnormal   Collection Time    09/28/14  9:03 PM      Result Value Ref Range   Glucose-Capillary 129 (*) 70 - 99 mg/dL  GLUCOSE, CAPILLARY     Status: Abnormal   Collection Time    09/29/14  6:50 AM      Result Value Ref Range   Glucose-Capillary 129 (*) 70 - 99 mg/dL  GLUCOSE, CAPILLARY     Status: Abnormal   Collection Time    09/29/14 11:36 AM      Result Value Ref Range   Glucose-Capillary 100 (*) 70 - 99 mg/dL   Comment 1 Notify RN    GLUCOSE, CAPILLARY     Status: Abnormal   Collection Time    09/29/14  4:31 PM      Result Value Ref Range   Glucose-Capillary 119 (*) 70 - 99 mg/dL  GLUCOSE, CAPILLARY     Status: Abnormal   Collection Time    09/29/14  9:35 PM      Result Value Ref Range   Glucose-Capillary 121 (*) 70 - 99 mg/dL   Comment 1 Notify RN    CBC WITH DIFFERENTIAL     Status: Abnormal   Collection Time    09/30/14  5:46 AM      Result Value Ref Range   WBC 4.9  4.0 - 10.5 K/uL   RBC 4.57  3.87 - 5.11 MIL/uL   Hemoglobin 13.6  12.0 -  15.0 g/dL   HCT 40.3  36.0 - 46.0 %   MCV 88.2  78.0 - 100.0 fL   MCH 29.8  26.0 - 34.0 pg   MCHC 33.7  30.0 - 36.0 g/dL   RDW 13.2  11.5 - 15.5 %   Platelets 189  150 - 400 K/uL   Neutrophils Relative % 60  43 - 77 %   Neutro Abs 2.9  1.7 - 7.7 K/uL   Lymphocytes Relative 23  12 - 46 %   Lymphs Abs 1.1  0.7 - 4.0 K/uL   Monocytes Relative 14 (*) 3 - 12 %   Monocytes Absolute 0.7  0.1 - 1.0 K/uL   Eosinophils Relative 2  0 - 5 %   Eosinophils Absolute 0.1  0.0 - 0.7 K/uL   Basophils Relative 1  0 - 1 %   Basophils Absolute 0.0  0.0 - 0.1 K/uL  COMPREHENSIVE METABOLIC PANEL     Status: Abnormal   Collection Time    09/30/14  5:46 AM      Result Value Ref Range   Sodium 142  137 - 147 mEq/L   Potassium 4.2  3.7 - 5.3 mEq/L   Chloride 103  96 - 112 mEq/L   CO2 25  19 - 32 mEq/L   Glucose, Bld 116 (*) 70 - 99 mg/dL   BUN 14  6 - 23 mg/dL   Creatinine, Ser 0.66  0.50 - 1.10 mg/dL    Calcium 9.3  8.4 - 10.5 mg/dL   Total Protein 6.9  6.0 - 8.3 g/dL   Albumin 3.2 (*) 3.5 - 5.2 g/dL   AST 20  0 - 37 U/L   ALT 12  0 - 35 U/L   Alkaline Phosphatase 53  39 - 117 U/L   Total Bilirubin 0.8  0.3 - 1.2 mg/dL   GFR calc non Af Amer 82 (*) >90 mL/min   GFR calc Af Amer >90  >90 mL/min   Comment: (NOTE)     The eGFR has been calculated using the CKD EPI equation.     This calculation has not been validated in all clinical situations.     eGFR's persistently <90 mL/min signify possible Chronic Kidney     Disease.   Anion gap 14  5 - 15  GLUCOSE, CAPILLARY     Status: Abnormal   Collection Time    09/30/14  6:58 AM      Result Value Ref Range   Glucose-Capillary 114 (*) 70 - 99 mg/dL   Comment 1 Notify RN    GLUCOSE, CAPILLARY     Status: None   Collection Time    09/30/14 12:03 PM      Result Value Ref Range   Glucose-Capillary 99  70 - 99 mg/dL  GLUCOSE, CAPILLARY     Status: Abnormal   Collection Time    09/30/14  4:26 PM      Result Value Ref Range   Glucose-Capillary 107 (*) 70 - 99 mg/dL   Comment 1 Notify RN    GLUCOSE, CAPILLARY     Status: Abnormal   Collection Time    09/30/14  8:53 PM      Result Value Ref Range   Glucose-Capillary 134 (*) 70 - 99 mg/dL   Comment 1 Notify RN    GLUCOSE, CAPILLARY     Status: Abnormal   Collection Time    10/01/14  6:41 AM      Result Value  Ref Range   Glucose-Capillary 114 (*) 70 - 99 mg/dL   Comment 1 Notify RN       HEENT: normal Cardio: RRR and no murmur Resp: CTA B/L and unlabored GI: BS positive and NT,ND Extremity:  Pulses positive and No Edema Skin:   Intact and Loop recorder site without active bleeding + ecchymosis Neuro: Cranial Nerve II-XII normal, Abnormal Motor 4/5 R UE, 5/5 in LUE and BLE and Abnormal FMC Ataxic/ dec FMC Musc/Skel:  Neck tender Gen NAD   Assessment/Plan: 1. 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 which require 3+ hours per day  of interdisciplinary therapy in a comprehensive inpatient rehab setting. Physiatrist is providing close team supervision and 24 hour management of active medical problems listed below. Physiatrist and rehab team continue to assess barriers to discharge/monitor patient progress toward functional and medical goals. FIM: FIM - Bathing Bathing Steps Patient Completed: Chest;Right Arm;Left Arm;Abdomen;Right upper leg;Left upper leg;Front perineal area;Buttocks Bathing: 4: Min-Patient completes 8-9 [id number] parts or 75+ percent  FIM - Upper Body Dressing/Undressing Upper body dressing/undressing steps patient completed: Thread/unthread right sleeve of front closure shirt/dress;Thread/unthread left sleeve of front closure shirt/dress;Pull shirt around back of front closure shirt/dress;Button/unbutton shirt Upper body dressing/undressing: 5: Set-up assist to: Obtain clothing/put away FIM - Lower Body Dressing/Undressing Lower body dressing/undressing steps patient completed: Pull pants up/down;Pull underwear up/down;Thread/unthread right underwear leg;Thread/unthread left underwear leg;Thread/unthread right pants leg;Thread/unthread left pants leg Lower body dressing/undressing: 4: Min-Patient completed 75 plus % of tasks  FIM - Toileting Toileting steps completed by patient: Adjust clothing prior to toileting;Performs perineal hygiene;Adjust clothing after toileting Toileting Assistive Devices: Grab bar or rail for support Toileting: 4: Steadying assist  FIM - TRadio producerDevices: Grab bars Toilet Transfers: 4-To toilet/BSC: Min A (steadying Pt. > 75%);4-From toilet/BSC: Min A (steadying Pt. > 75%)  FIM - Bed/Chair Transfer Bed/Chair Transfer Assistive Devices: Arm rests Bed/Chair Transfer: 4: Bed > Chair or W/C: Min A (steadying Pt. > 75%);4: Chair or W/C > Bed: Min A (steadying Pt. > 75%)  FIM - Locomotion: Wheelchair Distance: 100 Locomotion: Wheelchair: 2:  Travels 50 - 149 ft with minimal assistance (Pt.>75%) FIM - Locomotion: Ambulation Locomotion: Ambulation Assistive Devices: WAdministratorAmbulation/Gait Assistance: 4: Min guard Locomotion: Ambulation: 2: Travels 50 - 149 ft with minimal assistance (Pt.>75%)  Comprehension Comprehension Mode: Auditory Comprehension: 5-Follows basic conversation/direction: With extra time/assistive device  Expression Expression Mode: Verbal Expression: 5-Expresses basic needs/ideas: With no assist  Social Interaction Social Interaction: 5-Interacts appropriately 90% of the time - Needs monitoring or encouragement for participation or interaction.  Problem Solving Problem Solving: 5-Solves basic problems: With no assist  Memory Memory: 6-More than reasonable amt of time  Medical Problem List and Plan:  1. 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  2. DVT Prophylaxis/Anticoagulation: Pharmaceutical: Lovenox  3. Pain Management: Tylenol or tramadol prn for hip/knee pain.  4. Mood: LCSW to follow for evaluation and support.  5. Neuropsych: This patient is capable of making decisions on her own behalf.  6. Skin/Wound Care: Pressure relief measures.  7. Fluids/Electrolytes/Nutrition: Monitor I/O. Encourage fluid intake.  8. Blood pressure: Monitor every 8 hours.  9. DM type 2: Hgb A1c-6.0. Will monitor with ac/hs checks. Continue trajenta with SSI for elevated BS.  10. Chronic back pain/Left hip contusion--question radiculopathy: Will add Voltaren gel tid to help with symptoms.  11.  Neck pain add kpad and cont  analgesic cream  LOS (Days) 4 A FACE TO FACE EVALUATION WAS PERFORMED  Angelica Frandsen E 10/01/2014, 6:55 AM

## 2014-10-02 ENCOUNTER — Inpatient Hospital Stay (HOSPITAL_COMMUNITY): Payer: Medicare Other | Admitting: Occupational Therapy

## 2014-10-02 ENCOUNTER — Encounter (HOSPITAL_COMMUNITY): Payer: Medicare Other | Admitting: Rehabilitation

## 2014-10-02 ENCOUNTER — Inpatient Hospital Stay (HOSPITAL_COMMUNITY): Payer: Medicare Other | Admitting: Physical Therapy

## 2014-10-02 DIAGNOSIS — I69351 Hemiplegia and hemiparesis following cerebral infarction affecting right dominant side: Secondary | ICD-10-CM

## 2014-10-02 LAB — GLUCOSE, CAPILLARY
Glucose-Capillary: 117 mg/dL — ABNORMAL HIGH (ref 70–99)
Glucose-Capillary: 110 mg/dL — ABNORMAL HIGH (ref 70–99)
Glucose-Capillary: 132 mg/dL — ABNORMAL HIGH (ref 70–99)
Glucose-Capillary: 137 mg/dL — ABNORMAL HIGH (ref 70–99)

## 2014-10-02 LAB — URINALYSIS, ROUTINE W REFLEX MICROSCOPIC
BILIRUBIN URINE: NEGATIVE
Glucose, UA: 500 mg/dL — AB
Ketones, ur: 15 mg/dL — AB
NITRITE: POSITIVE — AB
PH: 6 (ref 5.0–8.0)
Protein, ur: NEGATIVE mg/dL
Specific Gravity, Urine: 1.014 (ref 1.005–1.030)
Urobilinogen, UA: 1 mg/dL (ref 0.0–1.0)

## 2014-10-02 LAB — URINE MICROSCOPIC-ADD ON

## 2014-10-02 MED ORDER — CEPHALEXIN 250 MG PO CAPS
250.0000 mg | ORAL_CAPSULE | Freq: Three times a day (TID) | ORAL | Status: DC
  Administered 2014-10-02 – 2014-10-09 (×22): 250 mg via ORAL
  Filled 2014-10-02 (×25): qty 1

## 2014-10-02 NOTE — Progress Notes (Signed)
Subjective/Complaints: 80 y.o. RH- female ([place] descent) with history of DM type 2, hyperlipidemia who was admitted on 09/22/14 with slurred speech, right sided weakness and right facial droop. CT head negative for abnormality and she was treated with tPA with improvements. MRI/MRA brain done revealing widespread areas of restricted diffusion are seen throughout the brain, most notable in the LEFT paramedian pons, consistent with multiple areas of acute infarction question emboli. 2D echo with EF 55-60% and no wall abnormality. Carotid dopplers without significant ICA stenosis. ST evaluation done revealing mild dysarthria and intact cognitive linguistic function. TEE without thrombus but positive bubble study and severe non-mobile plaque in descending thoracic aorta and distal portion of aortic arch. BLE dopplers done and negative for DVT. On 10/07, she had worsening of right sided weakness with increase in pain and numbness RLE as well as decrease in vision right eye. Loop recorder placed by Dr. Rayann Heman on 10/09. She has continued to complain of left hip pain and X rays done revealing mild degenerative changes and no fracture  With gestures and "dolor"  Established no neckpain today  Difficult to get ROS secondary to language Objective: Vital Signs: Blood pressure 119/51, pulse 70, temperature 98.3 F (36.8 C), temperature source Oral, resp. rate 18, weight 78.2 kg (172 lb 6.4 oz), SpO2 92.00%. No results found. Results for orders placed during the hospital encounter of 09/27/14 (from the past 72 hour(s))  GLUCOSE, CAPILLARY     Status: Abnormal   Collection Time    09/29/14  6:50 AM      Result Value Ref Range   Glucose-Capillary 129 (*) 70 - 99 mg/dL  GLUCOSE, CAPILLARY     Status: Abnormal   Collection Time    09/29/14 11:36 AM      Result Value Ref Range   Glucose-Capillary 100 (*) 70 - 99 mg/dL   Comment 1 Notify RN    GLUCOSE, CAPILLARY     Status: Abnormal   Collection Time   09/29/14  4:31 PM      Result Value Ref Range   Glucose-Capillary 119 (*) 70 - 99 mg/dL  GLUCOSE, CAPILLARY     Status: Abnormal   Collection Time    09/29/14  9:35 PM      Result Value Ref Range   Glucose-Capillary 121 (*) 70 - 99 mg/dL   Comment 1 Notify RN    CBC WITH DIFFERENTIAL     Status: Abnormal   Collection Time    09/30/14  5:46 AM      Result Value Ref Range   WBC 4.9  4.0 - 10.5 K/uL   RBC 4.57  3.87 - 5.11 MIL/uL   Hemoglobin 13.6  12.0 - 15.0 g/dL   HCT 40.3  36.0 - 46.0 %   MCV 88.2  78.0 - 100.0 fL   MCH 29.8  26.0 - 34.0 pg   MCHC 33.7  30.0 - 36.0 g/dL   RDW 13.2  11.5 - 15.5 %   Platelets 189  150 - 400 K/uL   Neutrophils Relative % 60  43 - 77 %   Neutro Abs 2.9  1.7 - 7.7 K/uL   Lymphocytes Relative 23  12 - 46 %   Lymphs Abs 1.1  0.7 - 4.0 K/uL   Monocytes Relative 14 (*) 3 - 12 %   Monocytes Absolute 0.7  0.1 - 1.0 K/uL   Eosinophils Relative 2  0 - 5 %   Eosinophils Absolute 0.1  0.0 - 0.7  K/uL   Basophils Relative 1  0 - 1 %   Basophils Absolute 0.0  0.0 - 0.1 K/uL  COMPREHENSIVE METABOLIC PANEL     Status: Abnormal   Collection Time    09/30/14  5:46 AM      Result Value Ref Range   Sodium 142  137 - 147 mEq/L   Potassium 4.2  3.7 - 5.3 mEq/L   Chloride 103  96 - 112 mEq/L   CO2 25  19 - 32 mEq/L   Glucose, Bld 116 (*) 70 - 99 mg/dL   BUN 14  6 - 23 mg/dL   Creatinine, Ser 0.66  0.50 - 1.10 mg/dL   Calcium 9.3  8.4 - 10.5 mg/dL   Total Protein 6.9  6.0 - 8.3 g/dL   Albumin 3.2 (*) 3.5 - 5.2 g/dL   AST 20  0 - 37 U/L   ALT 12  0 - 35 U/L   Alkaline Phosphatase 53  39 - 117 U/L   Total Bilirubin 0.8  0.3 - 1.2 mg/dL   GFR calc non Af Amer 82 (*) >90 mL/min   GFR calc Af Amer >90  >90 mL/min   Comment: (NOTE)     The eGFR has been calculated using the CKD EPI equation.     This calculation has not been validated in all clinical situations.     eGFR's persistently <90 mL/min signify possible Chronic Kidney     Disease.   Anion gap 14  5  - 15  GLUCOSE, CAPILLARY     Status: Abnormal   Collection Time    09/30/14  6:58 AM      Result Value Ref Range   Glucose-Capillary 114 (*) 70 - 99 mg/dL   Comment 1 Notify RN    GLUCOSE, CAPILLARY     Status: None   Collection Time    09/30/14 12:03 PM      Result Value Ref Range   Glucose-Capillary 99  70 - 99 mg/dL  GLUCOSE, CAPILLARY     Status: Abnormal   Collection Time    09/30/14  4:26 PM      Result Value Ref Range   Glucose-Capillary 107 (*) 70 - 99 mg/dL   Comment 1 Notify RN    GLUCOSE, CAPILLARY     Status: Abnormal   Collection Time    09/30/14  8:53 PM      Result Value Ref Range   Glucose-Capillary 134 (*) 70 - 99 mg/dL   Comment 1 Notify RN    GLUCOSE, CAPILLARY     Status: Abnormal   Collection Time    10/01/14  6:41 AM      Result Value Ref Range   Glucose-Capillary 114 (*) 70 - 99 mg/dL   Comment 1 Notify RN    GLUCOSE, CAPILLARY     Status: Abnormal   Collection Time    10/01/14 12:23 PM      Result Value Ref Range   Glucose-Capillary 118 (*) 70 - 99 mg/dL  GLUCOSE, CAPILLARY     Status: Abnormal   Collection Time    10/01/14  4:37 PM      Result Value Ref Range   Glucose-Capillary 121 (*) 70 - 99 mg/dL  GLUCOSE, CAPILLARY     Status: Abnormal   Collection Time    10/01/14  8:53 PM      Result Value Ref Range   Glucose-Capillary 146 (*) 70 - 99 mg/dL  URINALYSIS, ROUTINE W  REFLEX MICROSCOPIC     Status: Abnormal   Collection Time    10/02/14 12:33 AM      Result Value Ref Range   Color, Urine YELLOW  YELLOW   APPearance CLOUDY (*) CLEAR   Specific Gravity, Urine 1.014  1.005 - 1.030   pH 6.0  5.0 - 8.0   Glucose, UA 500 (*) NEGATIVE mg/dL   Hgb urine dipstick MODERATE (*) NEGATIVE   Bilirubin Urine NEGATIVE  NEGATIVE   Ketones, ur 15 (*) NEGATIVE mg/dL   Protein, ur NEGATIVE  NEGATIVE mg/dL   Urobilinogen, UA 1.0  0.0 - 1.0 mg/dL   Nitrite POSITIVE (*) NEGATIVE   Leukocytes, UA MODERATE (*) NEGATIVE  URINE MICROSCOPIC-ADD ON      Status: Abnormal   Collection Time    10/02/14 12:33 AM      Result Value Ref Range   Squamous Epithelial / LPF RARE  RARE   WBC, UA 21-50  <3 WBC/hpf   RBC / HPF 7-10  <3 RBC/hpf   Bacteria, UA MANY (*) RARE     HEENT: normal Cardio: RRR and no murmur Resp: CTA B/L and unlabored GI: BS positive and NT,ND Extremity:  Pulses positive and No Edema Skin:   Intact and Loop recorder site without active bleeding + ecchymosis Neuro: Cranial Nerve II-XII normal, Abnormal Motor 4/5 R UE, 5/5 in LUE and BLE and Abnormal FMC Ataxic/ dec FMC Musc/Skel:  Neck tender Gen NAD   Assessment/Plan: 1. 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 which require 3+ hours per day of interdisciplinary therapy in a comprehensive inpatient rehab setting. Physiatrist is providing close team supervision and 24 hour management of active medical problems listed below. Physiatrist and rehab team continue to assess barriers to discharge/monitor patient progress toward functional and medical goals. Team conference today please see physician documentation under team conference tab, met with team face-to-face to discuss problems,progress, and goals. Formulized individual treatment plan based on medical history, underlying problem and comorbidities. FIM: FIM - Bathing Bathing Steps Patient Completed: Chest;Right Arm;Left Arm;Abdomen;Right upper leg;Left upper leg;Front perineal area;Buttocks Bathing: 4: Min-Patient completes 8-9 [id number] parts or 75+ percent  FIM - Upper Body Dressing/Undressing Upper body dressing/undressing steps patient completed: Put head through opening of pull over shirt/dress Upper body dressing/undressing: 2: Max-Patient completed 25-49% of tasks FIM - Lower Body Dressing/Undressing Lower body dressing/undressing steps patient completed: Thread/unthread left underwear leg;Thread/unthread left pants leg;Pull pants up/down Lower body  dressing/undressing: 2: Max-Patient completed 25-49% of tasks  FIM - Toileting Toileting steps completed by patient: Adjust clothing prior to toileting;Performs perineal hygiene;Adjust clothing after toileting Toileting Assistive Devices: Grab bar or rail for support Toileting: 4: Steadying assist  FIM - TRadio producerDevices: Grab bars Toilet Transfers: 4-To toilet/BSC: Min A (steadying Pt. > 75%);4-From toilet/BSC: Min A (steadying Pt. > 75%)  FIM - Bed/Chair Transfer Bed/Chair Transfer Assistive Devices: Arm rests Bed/Chair Transfer: 3: Supine > Sit: Mod A (lifting assist/Pt. 50-74%/lift 2 legs;3: Sit > Supine: Mod A (lifting assist/Pt. 50-74%/lift 2 legs);4: Bed > Chair or W/C: Min A (steadying Pt. > 75%);3: Chair or W/C > Bed: Mod A (lift or lower assist)  FIM - Locomotion: Wheelchair Distance: 50 Locomotion: Wheelchair: 2: Travels 50 - 149 ft with minimal assistance (Pt.>75%) FIM - Locomotion: Ambulation Locomotion: Ambulation Assistive Devices: Other (comment);Walker - Rolling (HHA) Ambulation/Gait Assistance: 3: Mod assist;4: Min assist (Mod without device, Min with RW) Locomotion: Ambulation: 2: Travels 50 -  149 ft with minimal assistance (Pt.>75%)  Comprehension Comprehension Mode: Auditory Comprehension: 5-Understands complex 90% of the time/Cues < 10% of the time (speaks spanish first lang)  Expression Expression Mode: Verbal Expression: 5-Expresses complex 90% of the time/cues < 10% of the time  Social Interaction Social Interaction: 7-Interacts appropriately with others - No medications needed.  Problem Solving Problem Solving: 7-Solves complex problems: Recognizes & self-corrects  Memory Memory: 5-Requires cues to use assistive device  Medical Problem List and Plan:  1. 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  2. DVT Prophylaxis/Anticoagulation: Pharmaceutical: Lovenox  3. Pain  Management: Tylenol or tramadol prn for hip/knee pain.  4. Mood: LCSW to follow for evaluation and support.  5. Neuropsych: This patient is capable of making decisions on her own behalf.  6. Skin/Wound Care: Pressure relief measures.  7. Fluids/Electrolytes/Nutrition: Monitor I/O. Encourage fluid intake.  8. Blood pressure: Monitor every 8 hours.  9. DM type 2: Hgb A1c-6.0. Will monitor with ac/hs checks. Continue trajenta with SSI for elevated BS.  10. Chronic back pain/Left hip contusion--question radiculopathy: Will add Voltaren gel tid to help with symptoms.  11.  Neck pain add kpad and cont analgesic cream 12.  UTI start Keflex pnd cx LOS (Days) 5 A FACE TO FACE EVALUATION WAS PERFORMED  Jean Ward 10/02/2014, 6:49 AM

## 2014-10-02 NOTE — Patient Care Conference (Signed)
Inpatient RehabilitationTeam Conference and Plan of Care Update Date: 10/02/2014   Time: 11:30 AM    Patient Name: Jean Ward      Medical Record Number: [id number]  Date of Birth: [birth year] Sex: Female         Room/Bed: 4M03C/4M03C-01 Payor Info: Payor: MEDICARE / Plan: MEDICARE PART A AND B / Product Type: *No Product type* /    Admitting Diagnosis: stroke cva  Admit Date/Time:  09/27/2014  7:32 PM Admission Comments: No comment available   Primary Diagnosis:  <principal problem not specified> Principal Problem: <principal problem not specified>  Patient Active Problem List   Diagnosis Date Noted  . CVA (cerebral infarction) 09/22/2014  . Diabetes mellitus type 2, controlled, without complications 09/22/2014    Expected Discharge Date: Expected Discharge Date: 10/10/14  Team Members Present: Physician leading conference: Dr. Claudette LawsAndrew Kirsteins Social Worker Present: Dossie DerBecky Keishawn Darsey, LCSW Nurse Present: Carmie EndAngie Joyce, RN PT Present: Edman CircleAudra Hall, PT;Emily Parcell, PT;Rhyder Bratz Evelina BucyVarner, PT OT Present: Callie FieldingKatie Pittman, Heath LarkT;James McGuire, OT SLP Present: Jackalyn LombardNicole Page, SLP PPS Coordinator present : Edson SnowballBecky Windsor, PT     Current Status/Progress Goal Weekly Team Focus  Medical   UTI, ?orthostasis vs anxiety causing dizziness  maintain medical stability during rehab  treat UTI empirically await cultures   Bowel/Bladder   Continent of bowel and bladder; LBM 10/13  Remain continent of bowel and bladder with min assist.  Assess for constipation and treat prn   Swallow/Nutrition/ Hydration     WFL        ADL's   Overall min-max  Overall supervision to min assist (some LTGs downgraded from Eval)  sit><stands, functional mobility during BADL, RUE functional use, safe bathroom transfers, right visual/spatial inattention, static and dynamic balance, patient and family education   Mobility   S for bed mobility, min A for stand pivot transfers, min A gait without device, min/guard with use of  RW.  Limited by decreased attention to R, decreased balance strategies/awareness  mod I from w/c level and S for gait   balance, gait, NMR for RLE/UE, pt/family education, transfers.    Communication     [facility]        Safety/Cognition/ Behavioral Observations    no unsafe behaviors        Pain   No c/o pain  < 3  Assess and treat for pain q shift   Skin   Loop recorder to left chest  Patients skin will remain free from infection or breakdown with min assist.  Assess skin q shift and prn      *See Care Plan and progress notes for long and short-term goals.  Barriers to Discharge: still requires physical assist, dizziness of unclear etiology    Possible Resolutions to Barriers:  see above    Discharge Planning/Teaching Needs:  [place] 24 hr supervision level.  Try to make arrnagements for new PCP and follow up      Team Discussion:  Balance issues-no recovering once starts to fall.  UTI being treated starting today. BP better controlled. Outing today. To go home with family who can provide 24 hr care.  Needs interpreter for therapies.  Revisions to Treatment Plan:  None   Continued Need for Acute Rehabilitation Level of Care: The patient requires daily medical management by a physician with specialized training in physical medicine and rehabilitation for the following conditions: Daily direction of a multidisciplinary physical rehabilitation program to ensure safe treatment  while eliciting the highest outcome that is of practical value to the patient.: Yes Daily medical management of patient stability for increased activity during participation in an intensive rehabilitation regime.: Yes Daily analysis of laboratory values and/or radiology reports with any subsequent need for medication adjustment of medical intervention for : Neurological problems;Other  [name], Lemar LivingsRebecca G 10/02/2014, 12:59 PM

## 2014-10-02 NOTE — Progress Notes (Signed)
Occupational Therapy Session Note  Patient Details  Name: Jean Ward MRN: [id number] Date of Birth: 1933/02/17  Today's Date: 10/02/2014 OT Individual Time: 1005-1108 and 1310- 1423 OT Individual Time Calculation (min): 63 min and 73 min   Short Term Goals: Week 1:  OT Short Term Goal 1 (Week 1): Pt will perform tub transfer onto TTB with Min A in order to increase I with functional transfers. OT Short Term Goal 2 (Week 1): Pt will perform LB dressing with Min A in order to increase I in self care. OT Short Term Goal 3 (Week 1): Pt will perform bathing with Min A in shower in order to increase I with self care. OT Short Term Goal 4 (Week 1): Pt will perform UB dressing with set up A in order to increase I in self care OT Short Term Goal 5 (Week 1): Pt will perform toilet transfer onto standard height toilet with min A in order to increase I in functional transfers.  Skilled Therapeutic Interventions/Progress Updates:  Session 1: Pt supine in bed with son, daughter in law, and interpreter present in room for ADL session. Pt with no c/o pain this session but does reporting, "I feel weak." Pt does not have c/o dizziness during this AM session. Pt ambulated with RW to obtain clothing and items needed for shower with min A for balance. Pt ambulated into bathroom to sit onto TTB with steady assist and use of RW, Bathing performed with min A as well secondary to pt requiring assist with balance while washing lower legs and feet. Therapist discussed and demonstrated bringing heels to knee in order to keep from bending down. Pt demonstrated understanding with min A secondary to weakness. Pt required verbal cues and assist for safety awareness to lock breaks on wheelchair prior to standing. Pt performed dressing seated in wheelchair at sink side. When standing for clothing management pt required maximal encouragement to use R UE for tasks as well. Standing balance became Mod A as pt with increasing  posterior lean. Pt seated in wheelchair awaiting next therapy upon exiting the room.   Session 2: Pt refusing to attend scheduled outing this session but agreed to participate in session with therapist individually. Pt with no c/o pain this session. Interpreter not present so therapist utilized Environmental managergoogle translator to talk to pt as well as for pt to talk to therapist. Pt ambulated to restroom with RW and steady assist. Toilet transfer onto standard toilet with min A and verbal cues for proper placement. Pt requiring min verbal cues to reach back when sitting on toilet, chair, bed, etc. Toileting with steady assist for clothing management. Pt ambulated to w/c and propelled self with B UEs and B LEs at times. Pt requiring multiple rest breaks as pt reports increased fatigue. Pt propelled wheelchair ~250 + feet onto elevator and into gift shop. Pt required min A to get over deep/high thresholds within gift shop as well as outside on sidewalk. On 2 instances pt wheelchair drifted into bookcases on the right , secondary to inattention/weakness, which make it difficult to turn wheelchair at times. Pt ambulated into public restroom with assistance to open initial door secondary to its heaviness. Pt transferred onto toilet in handicap stall with steady assist for safety. Pt propelled self back to room and transferred to bed with steady assist. Pt supine in bed resting with call bell and all other needed items within reach upon exiting the room.   Therapy Documentation Precautions:  Precautions Precautions: Fall  Precaution Comments: R inattention Restrictions Weight Bearing Restrictions: No Pain: Pain Assessment Pain Assessment: No/denies pain  See FIM for current functional status  Therapy/Group: Individual Therapy  Lowella Gripittman, Marit Goodwill L 10/02/2014, 11:58 AM

## 2014-10-02 NOTE — Progress Notes (Signed)
Pt blood pressure was 110/40 manually while lying down napping. Pam was notified and ordered for us to recheck the blood pressure in 1 hour. Patient is asymptomatic.

## 2014-10-02 NOTE — Progress Notes (Signed)
Social Work Jean Ward Fina Heizer, LCSW Social Worker Signed  Patient Care Conference Service date: 10/02/2014 12:59 PM  Inpatient RehabilitationTeam Conference and Plan of Care Update Date: 10/02/2014   Time: 11:30 AM     Patient Name: Jean Ward       Medical Record Number: [id number]   Date of Birth: 1933-02-18 Sex: Female         Room/Bed: 4M03C/4M03C-01 Payor Info: Payor: MEDICARE / Plan: MEDICARE PART A AND B / Product Type: *No Product type* /   Admitting Diagnosis: stroke cva   Admit Date/Time:  09/27/2014  7:32 PM Admission Comments: No comment available   Primary Diagnosis:  <principal problem not specified> Principal Problem: <principal problem not specified>    Patient Active Problem List     Diagnosis  Date Noted   .  CVA (cerebral infarction)  09/22/2014   .  Diabetes mellitus type 2, controlled, without complications  09/22/2014     Expected Discharge Date: Expected Discharge Date: 10/10/14  Team Members Present: Physician leading conference: Dr. Claudette LawsAndrew Kirsteins Social Worker Present: Dossie DerBecky Cory Kitt, LCSW Nurse Present: Carmie EndAngie Joyce, RN PT Present: Edman CircleAudra Hall, PT;Emily Parcell, PT;Osmany Azer Evelina BucyVarner, PT OT Present: Callie FieldingKatie Pittman, Heath LarkT;James McGuire, OT SLP Present: Jackalyn LombardNicole Page, SLP PPS Coordinator present : Edson SnowballBecky Windsor, PT        Current Status/Progress  Goal  Weekly Team Focus   Medical     UTI, ?orthostasis vs anxiety causing dizziness  maintain medical stability during rehab  treat UTI empirically await cultures   Bowel/Bladder     Continent of bowel and bladder; LBM 10/13  Remain continent of bowel and bladder with min assist.  Assess for constipation and treat prn   Swallow/Nutrition/ Hydration     WFL       ADL's     Overall min-max  Overall supervision to min assist (some LTGs downgraded from Eval)  sit><stands, functional mobility during BADL, RUE functional use, safe bathroom transfers, right visual/spatial inattention, static and dynamic balance,  patient and family education   Mobility     S for bed mobility, min A for stand pivot transfers, min A gait without device, min/guard with use of RW.  Limited by decreased attention to R, decreased balance strategies/awareness  mod I from w/c level and S for gait   balance, gait, NMR for RLE/UE, pt/family education, transfers.    Communication     [facility]       Safety/Cognition/ Behavioral Observations    no unsafe behaviors       Pain     No c/o pain  < 3  Assess and treat for pain q shift   Skin     Loop recorder to left chest  Patients skin will remain free from infection or breakdown with min assist.  Assess skin q shift and prn     *See Care Plan and progress notes for long and short-term goals.    Barriers to Discharge:  still requires physical assist, dizziness of unclear etiology      Possible Resolutions to Barriers:    see above      Discharge Planning/Teaching Needs:    Home [place] where she will have 24 hr supervision level.  Try to make arrnagements for new PCP and follow up      Team Discussion:    Balance issues-no recovering once starts to fall.  UTI being treated starting today. BP better controlled. Outing today. To go home with family  who can provide 24 hr care.  Needs interpreter for therapies.   Revisions to Treatment Plan:    None    Continued Need for Acute Rehabilitation Level of Care: The patient requires daily medical management by a physician with specialized training in physical medicine and rehabilitation for the following conditions: Daily direction of a multidisciplinary physical rehabilitation program to ensure safe treatment while eliciting the highest outcome that is of practical value to the patient.: Yes Daily medical management of patient stability for increased activity during participation in an intensive rehabilitation regime.: Yes Daily analysis of laboratory values and/or radiology reports with any subsequent need  for medication adjustment of medical intervention for : Neurological problems;Other  Jean Ward, Jean Ward 10/02/2014, 12:59 PM          [name], female   DOB: [birth year], 80 y.o.   MRN: [id number]

## 2014-10-02 NOTE — Progress Notes (Signed)
Physical Therapy Session Note  Patient Details  Name: Jean Ward MRN: [id number] Date of Birth: May 13, 1933  Today's Date: 10/02/2014 PT Individual Time: 1120-1212 PT Individual Time Calculation (min): 52 min   Short Term Goals: Week 1:  PT Short Term Goal 1 (Week 1): Pt will perform supine<>sit with mod I with HOB flat without bed rails. PT Short Term Goal 2 (Week 1): Pt will consistently transfer from bed<>w/c with supervision to min guard with 25% cueing. PT Short Term Goal 3 (Week 1): Pt will ambulate 100' with supervision using LRAD. PT Short Term Goal 4 (Week 1): Pt will negotiate 2 stairs with 2 rails and min A. PT Short Term Goal 5 (Week 1): Pt will negotiate curb step with LRAD and supervision.   Skilled Therapeutic Interventions/Progress Updates:    Pt received seated in recliner; agreeable to therapy. Session focused on increasing pt independence with bed mobility, promoting safe use of rolling walker with functional transfers. Performed gait 2x75' in controlled and home environments with rolling walker, R hand orthosis, and min guard-min A; verbal/tactile cueing for decreased gait speed, upright posture, lateral weight shift to L side, and for increased RLE clearance. In rehab apartment, pt performed multiple sit<>stand transfers and short-distance ambulation trials (<20') with rolling walker as described above. During sit<>stand transfers, pt required supervision and questioning cues for safe proximity to surface prior to sitting, safe use of walker, and safe hand placement. Pt with effective within-session carryover of safe proximity but required reinforcement for hand placement, management of R hand orthosis. In rehab apartment, pt performed sit>supine with mod A for bilat LE management. Explained, demonstrated use of LLE for RLE management; will require reinforcement. Provided paper handout in Spanish for bridging exercise for closed-chain strengthening of hip extensors. Departed  with pt seated in recliner with lunch tray setup, quick release belt in place for safety, and all needs within reach.  Therapy Documentation Precautions:  Precautions Precautions: Fall Precaution Comments: R inattention Restrictions Weight Bearing Restrictions: No Pain: Pain Assessment Pain Assessment: No/denies pain Locomotion : Ambulation Ambulation/Gait Assistance: 4: Min assist Wheelchair Mobility Distance: 50   See FIM for current functional status  Therapy/Group: Individual Therapy  Mayreli Alden, Lorenda IshiharaBlair A 10/02/2014, 12:24 PM

## 2014-10-02 NOTE — Progress Notes (Signed)
Social Work [name], female   DOB: June 16, 1933, 80 y.o.   MRN: [id number] Spoke with daughter-Evelyn and son in-law-Jacinto to inform them of team conference goals of supervision level and discharge 10/22. They plan to discuss the best option for her and will discuss with one another.  Aware she will need 24 hr supervision for safety and her balance issues. Local daughter does not have someone to be with during the day while she works, son in-law reports they have multiple family members in [place] and can provide this level. Work on a safe discharge plan.

## 2014-10-03 ENCOUNTER — Encounter (HOSPITAL_COMMUNITY): Payer: Self-pay | Admitting: Physical Medicine and Rehabilitation

## 2014-10-03 ENCOUNTER — Inpatient Hospital Stay (HOSPITAL_COMMUNITY): Payer: Medicare Other | Admitting: Occupational Therapy

## 2014-10-03 ENCOUNTER — Inpatient Hospital Stay (HOSPITAL_COMMUNITY): Payer: Medicare Other | Admitting: Rehabilitation

## 2014-10-03 ENCOUNTER — Ambulatory Visit (HOSPITAL_COMMUNITY): Payer: Medicare Other | Admitting: *Deleted

## 2014-10-03 DIAGNOSIS — E785 Hyperlipidemia, unspecified: Secondary | ICD-10-CM | POA: Diagnosis present

## 2014-10-03 DIAGNOSIS — M25569 Pain in unspecified knee: Secondary | ICD-10-CM

## 2014-10-03 DIAGNOSIS — G8929 Other chronic pain: Secondary | ICD-10-CM | POA: Diagnosis present

## 2014-10-03 DIAGNOSIS — I69851 Hemiplegia and hemiparesis following other cerebrovascular disease affecting right dominant side: Secondary | ICD-10-CM

## 2014-10-03 DIAGNOSIS — N390 Urinary tract infection, site not specified: Secondary | ICD-10-CM | POA: Diagnosis not present

## 2014-10-03 LAB — GLUCOSE, CAPILLARY
GLUCOSE-CAPILLARY: 114 mg/dL — AB (ref 70–99)
Glucose-Capillary: 111 mg/dL — ABNORMAL HIGH (ref 70–99)
Glucose-Capillary: 145 mg/dL — ABNORMAL HIGH (ref 70–99)
Glucose-Capillary: 125 mg/dL — ABNORMAL HIGH (ref 70–99)

## 2014-10-03 NOTE — Plan of Care (Signed)
Problem: RH Simple Meal Prep Goal: LTG Patient will perform simple meal prep w/assist (OT) LTG: Patient will perform simple meal prep with assistance, with/without cues (OT).  Outcome: Not Applicable Date Met:  32/95/18 Goal to be discontinued  10/03/14 as pt reports she will not be performing this task and refuses to engage in during therapy sessions.  Problem: RH Laundry Goal: LTG Patient will perform laundry w/assist, cues (OT) LTG: Patient will perform laundry with assistance, with/without cues (OT).  Outcome: Not Applicable Date Met:  84/16/60 Goal to be discontinued as of 10/03/13 as pt reports she will not be engaging in this task after discharge from hospital and family will be doing for her.

## 2014-10-03 NOTE — Progress Notes (Signed)
Occupational Therapy Weekly Progress Note  Patient Details  Name: Jean Ward MRN: [id number] Date of Birth: Sep 17, 1933  Beginning of progress report period: August 29, 2014 End of progress report period: September 04, 2014  Today's Date: 10/04/2014 OT Individual Time: 5597-4163 OT Individual Time Calculation (min): 60 min    Patient has met 3 of 5 short term goals. Pt has been progressing towards short term and long term goals this week. Pt has continued difficulty with dynamic standing balance which has made it difficult for her to met short term goal regarding LB dressing and UB dressing. Pt requires min A for balance with use of RW for ambulating and clothing management.  Patient continues to demonstrate the following deficits: decreased I in self care, decreased dynamic standing balance, decreased functional transfers, decreased functional mobility, decreased [place] and strength in R UE and therefore will continue to benefit from skilled OT intervention to enhance overall performance with BADL.  Patient Progressing towards LTG with some goals being revised. Many goals downgraded from Mod I to supervision for safety. Pt also wishing to discontinue IADL goals for simple meal prep and laundry.  Continue plan of care.  OT Short Term Goals Week 1:  OT Short Term Goal 1 (Week 1): Pt will perform tub transfer onto TTB with Min A in order to increase I with functional transfers. OT Short Term Goal 1 - Progress (Week 1): Met OT Short Term Goal 2 (Week 1): Pt will perform LB dressing with Min A in order to increase I in self care. OT Short Term Goal 2 - Progress (Week 1): Progressing toward goal OT Short Term Goal 3 (Week 1): Pt will perform bathing with Min A in shower in order to increase I with self care. OT Short Term Goal 3 - Progress (Week 1): Met OT Short Term Goal 4 (Week 1): Pt will perform UB dressing with set up A in order to increase I in self care OT Short Term Goal 4 -  Progress (Week 1): Progressing toward goal OT Short Term Goal 5 (Week 1): Pt will perform toilet transfer onto standard height toilet with min A in order to increase I in functional transfers. OT Short Term Goal 5 - Progress (Week 1): Met Week 2:  OT Short Term Goal 1 (Week 2): STG=LTGs secondary to expected discharge date  Skilled Therapeutic Interventions/Progress Updates:  Upon entering the room, pt seated in recliner chair with quick release belt on a interpreter present. Pt reporting no c/o pain and that she feels stronger since she had a good night of sleep. Pt ambulated with steady assist and RW to obtain clothing from dresser and placing onto bed. Pt ambulated into bathroom for toileting and toilet transfer with steady assist for clothing management. Pt performed transfer with RW <> TTB with steady assist for safety. Bathing with assist to wash B feet this session. Once completed, pt ambulated to [facility] to sit for dressing. UB with Min A to hook bra this session. Pt able to thread R LE this session which she was not previously able to perform. Standing at sink for grooming tasks with R UE supported on counter and supervision. Pt utilizing R UE at diminished level for ADL tasks but not requiring verbal cues to use this session. Pt seated in recliner chair with call bell and interpreter present upon exiting the room.   Therapy Documentation Precautions:  Precautions Precautions: Fall Precaution Comments: R inattention Restrictions Weight Bearing Restrictions: No  See FIM for  current functional status  Therapy/Group: Individual Therapy  Phineas Semen 10/03/2014, 7:44 PM

## 2014-10-03 NOTE — Progress Notes (Signed)
Physical Therapy Session Note  Patient Details  Name: Jean Ward MRN: [id number] Date of Birth: 10-Jan-1933  Today's Date: 10/03/2014 PT Individual Time: 0930-1030 PT Individual Time Calculation (min): 60 min   Short Term Goals: Week 1:  PT Short Term Goal 1 (Week 1): Pt will perform supine<>sit with mod I with HOB flat without bed rails. PT Short Term Goal 2 (Week 1): Pt will consistently transfer from bed<>w/c with supervision to min guard with 25% cueing. PT Short Term Goal 3 (Week 1): Pt will ambulate 100' with supervision using LRAD. PT Short Term Goal 4 (Week 1): Pt will negotiate 2 stairs with 2 rails and min A. PT Short Term Goal 5 (Week 1): Pt will negotiate curb step with LRAD and supervision.   Skilled Therapeutic Interventions/Progress Updates:   Pt received sitting in recliner in room, agreeable to therapy session.  Interpretor present during session to assist with translation.  Performed gait to/from therapy gym x 110' x 2 reps with RW and R hand orthosis at min A level with cues for upright posture, attention to R hand and attention to task, as she would attempt conversation and have mild LOB, requiring min A to correct.  Once in therapy gym, performed dynamic standing balance to challenge hip strategy and also to work on fine motor coordination/attention to R hand.  Stood on balance beam at min/mod A while placing yellow clothes pins with R hand to metal pole.  Does require some HOH assist to complete task.  Discussed that as result of the stroke, her arm and leg are not strong, have decreased sensation, and the fact that she doesn't pay attention to it during tasks makes using it very difficult.  Pt verbalized understanding.  Progressed to performing step ups leading with R LE to work on strengthening of quad and glutes.  Then worked on performing stairs as she will have them to enter her home here and family's home in [place].  Pt performed 5 steps with single handrail to  simulate home entry at Min A level with max verbal cues for sequencing/technique.  Pt states she feels dizzy, therefore checked BP and note BP was 130's/40's which has been normal for pt during stay.  TEDS donned to assist with this.  Pt ambulated back to room as stated above.  Left in recliner with QRB donned and all needs in reach.   Therapy Documentation Precautions:  Precautions Precautions: Fall Precaution Comments: R inattention Restrictions Weight Bearing Restrictions: No   Pain: Pt with mild c/o pain in shoulders, better with rest.   Locomotion : Ambulation Ambulation/Gait Assistance: 4: Min assist;4: Min guard   See FIM for current functional status  Therapy/Group: Individual Therapy  Vista Deckarcell, Jacy Brocker Ann 10/03/2014, 12:15 PM

## 2014-10-03 NOTE — Progress Notes (Signed)
Subjective/Complaints: [name] with history of DM type 2, hyperlipidemia who was admitted on 09/22/14 with slurred speech, right sided weakness and right facial droop. CT head negative for abnormality and she was treated with tPA with improvements. MRI/MRA brain done revealing widespread areas of restricted diffusion are seen throughout the brain, most notable in the LEFT paramedian pons, consistent with multiple areas of acute infarction question emboli. 2D echo with EF 55-60% and no wall abnormality. Carotid dopplers without significant ICA stenosis. ST evaluation done revealing mild dysarthria and intact cognitive linguistic function. TEE without thrombus but positive bubble study and severe non-mobile plaque in descending thoracic aorta and distal portion of aortic arch. BLE dopplers done and negative for DVT. On 10/07, she had worsening of right sided weakness with increase in pain and numbness RLE as well as decrease in vision right eye. Loop recorder placed by Dr. Johney FrameAllred on 10/09. She has continued to complain of left hip pain and X rays done revealing mild degenerative changes and no fracture   Smiling, with gesturing able to follow MMT commands Difficult to get ROS secondary to language Objective: Vital Signs: Blood pressure 122/54, pulse 71, temperature 98.6 F (37 C), temperature source Oral, resp. rate 18, weight 78.2 kg (172 lb 6.4 oz), SpO2 94.00%. No results found. Results for orders placed during the hospital encounter of 09/27/14 (from the past 72 hour(s))  GLUCOSE, CAPILLARY     Status: Abnormal   Collection Time    09/30/14  6:58 AM      Result Value Ref Range   Glucose-Capillary 114 (*) 70 - 99 mg/dL   Comment 1 Notify RN    GLUCOSE, CAPILLARY     Status: None   Collection Time    09/30/14 12:03 PM      Result Value Ref Range   Glucose-Capillary 99  70 - 99 mg/dL  GLUCOSE, CAPILLARY     Status: Abnormal   Collection Time    09/30/14  4:26  PM      Result Value Ref Range   Glucose-Capillary 107 (*) 70 - 99 mg/dL   Comment 1 Notify RN    GLUCOSE, CAPILLARY     Status: Abnormal   Collection Time    09/30/14  8:53 PM      Result Value Ref Range   Glucose-Capillary 134 (*) 70 - 99 mg/dL   Comment 1 Notify RN    GLUCOSE, CAPILLARY     Status: Abnormal   Collection Time    10/01/14  6:41 AM      Result Value Ref Range   Glucose-Capillary 114 (*) 70 - 99 mg/dL   Comment 1 Notify RN    GLUCOSE, CAPILLARY     Status: Abnormal   Collection Time    10/01/14 12:23 PM      Result Value Ref Range   Glucose-Capillary 118 (*) 70 - 99 mg/dL  GLUCOSE, CAPILLARY     Status: Abnormal   Collection Time    10/01/14  4:37 PM      Result Value Ref Range   Glucose-Capillary 121 (*) 70 - 99 mg/dL  GLUCOSE, CAPILLARY     Status: Abnormal   Collection Time    10/01/14  8:53 PM      Result Value Ref Range   Glucose-Capillary 146 (*) 70 - 99 mg/dL  URINALYSIS, ROUTINE W REFLEX MICROSCOPIC     Status: Abnormal   Collection Time    10/02/14 12:33 AM  Result Value Ref Range   Color, Urine YELLOW  YELLOW   APPearance CLOUDY (*) CLEAR   Specific Gravity, Urine 1.014  1.005 - 1.030   pH 6.0  5.0 - 8.0   Glucose, UA 500 (*) NEGATIVE mg/dL   Hgb urine dipstick MODERATE (*) NEGATIVE   Bilirubin Urine NEGATIVE  NEGATIVE   Ketones, ur 15 (*) NEGATIVE mg/dL   Protein, ur NEGATIVE  NEGATIVE mg/dL   Urobilinogen, UA 1.0  0.0 - 1.0 mg/dL   Nitrite POSITIVE (*) NEGATIVE   Leukocytes, UA MODERATE (*) NEGATIVE  URINE MICROSCOPIC-ADD ON     Status: Abnormal   Collection Time    10/02/14 12:33 AM      Result Value Ref Range   Squamous Epithelial / LPF RARE  RARE   WBC, UA 21-50  <3 WBC/hpf   RBC / HPF 7-10  <3 RBC/hpf   Bacteria, UA MANY (*) RARE  GLUCOSE, CAPILLARY     Status: Abnormal   Collection Time    10/02/14  6:48 AM      Result Value Ref Range   Glucose-Capillary 117 (*) 70 - 99 mg/dL  GLUCOSE, CAPILLARY     Status: Abnormal    Collection Time    10/02/14 11:27 AM      Result Value Ref Range   Glucose-Capillary 110 (*) 70 - 99 mg/dL  GLUCOSE, CAPILLARY     Status: Abnormal   Collection Time    10/02/14  4:53 PM      Result Value Ref Range   Glucose-Capillary 132 (*) 70 - 99 mg/dL  GLUCOSE, CAPILLARY     Status: Abnormal   Collection Time    10/02/14  9:19 PM      Result Value Ref Range   Glucose-Capillary 137 (*) 70 - 99 mg/dL  GLUCOSE, CAPILLARY     Status: Abnormal   Collection Time    10/03/14  6:53 AM      Result Value Ref Range   Glucose-Capillary 111 (*) 70 - 99 mg/dL     HEENT: normal Cardio: RRR and no murmur Resp: CTA B/L and unlabored GI: BS positive and NT,ND Extremity:  Pulses positive and No Edema Skin:   Intact and Loop recorder site without active bleeding + ecchymosis Neuro: Cranial Nerve II-XII normal, Abnormal Motor 4/5 R UE, 5/5 in LUE and BLE and Abnormal FMC Ataxic/ dec FMC Musc/Skel:  Neck tender Gen NAD   Assessment/Plan: 1. 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 which require 3+ hours per day of interdisciplinary therapy in a comprehensive inpatient rehab setting. Physiatrist is providing close team supervision and 24 hour management of active medical problems listed below. Physiatrist and rehab team continue to assess barriers to discharge/monitor patient progress toward functional and medical goals. Tent D/C 10/22 FIM: FIM - Bathing Bathing Steps Patient Completed: Chest;Right Arm;Left Arm;Abdomen;Front perineal area;Buttocks;Right upper leg;Left upper leg Bathing: 4: Min-Patient completes 8-9 72f 10 parts or 75+ percent  FIM - Upper Body Dressing/Undressing Upper body dressing/undressing steps patient completed: Put head through opening of pull over shirt/dress Upper body dressing/undressing: 2: Max-Patient completed 25-49% of tasks FIM - Lower Body Dressing/Undressing Lower body dressing/undressing steps patient  completed: Thread/unthread left underwear leg;Thread/unthread left pants leg Lower body dressing/undressing: 2: Max-Patient completed 25-49% of tasks  FIM - Toileting Toileting steps completed by patient: Adjust clothing prior to toileting;Performs perineal hygiene;Adjust clothing after toileting Toileting Assistive Devices: Grab bar or rail for support Toileting: 0: Activity  did not occur  FIM - Diplomatic Services operational officerToilet Transfers Toilet Transfers Assistive Devices: Grab bars Toilet Transfers: 0-Activity did not occur  FIM - BankerBed/Chair Transfer Bed/Chair Transfer Assistive Devices: Arm rests;Walker;Orthosis (R hand orthosis) Bed/Chair Transfer: 5: Supine > Sit: Supervision (verbal cues/safety issues);3: Sit > Supine: Mod A (lifting assist/Pt. 50-74%/lift 2 legs);5: Chair or W/C > Bed: Supervision (verbal cues/safety issues);5: Bed > Chair or W/C: Supervision (verbal cues/safety issues)  FIM - Locomotion: Wheelchair Distance: 50 Locomotion: Wheelchair: 1: Travels less than 50 ft with supervision, cueing or coaxing FIM - Locomotion: Ambulation Locomotion: Ambulation Assistive Devices: Other (comment);Walker - Rolling (R hand orthosis) Ambulation/Gait Assistance: 4: Min assist Locomotion: Ambulation: 2: Travels 50 - 149 ft with minimal assistance (Pt.>75%)  Comprehension Comprehension Mode: Auditory Comprehension: 5-Understands basic 90% of the time/requires cueing < 10% of the time  Expression Expression Mode: Verbal Expression: 5-Expresses basic 90% of the time/requires cueing < 10% of the time.  Social Interaction Social Interaction: 5-Interacts appropriately 90% of the time - Needs monitoring or encouragement for participation or interaction.  Problem Solving Problem Solving: 5-Solves basic 90% of the time/requires cueing < 10% of the time  Memory Memory: 5-Requires cues to use assistive device  Medical Problem List and Plan:  1. Functional deficits secondary to embolic left paramedian  pontine infarct of unknown source.  2. DVT Prophylaxis/Anticoagulation: Pharmaceutical: Lovenox  3. Pain Management: Tylenol or tramadol prn for hip/knee pain.  4. Mood: LCSW to follow for evaluation and support.  5. Neuropsych: This patient is capable of making decisions on her own behalf.  6. Skin/Wound Care: Pressure relief measures.  7. Fluids/Electrolytes/Nutrition: Monitor I/O. Encourage fluid intake.  8. Blood pressure: Monitor every 8 hours.  9. DM type 2: Hgb A1c-6.0. Will monitor with ac/hs checks. Continue trajenta with SSI for elevated BS.  10. Chronic back pain/Left hip contusion--question radiculopathy: Will add Voltaren gel tid to help with symptoms.  11.  Neck pain add kpad and cont analgesic cream 12.  UTI start Keflex pnd cx LOS (Days) 6 A FACE TO FACE EVALUATION WAS PERFORMED  Phoenix Dresser E 10/03/2014, 6:55 AM

## 2014-10-03 NOTE — Progress Notes (Signed)
Recreational Therapy Session Note  Patient Details  Name: Quin Hooporma Claude MRN: [id number] Date of Birth: March 07, 1933 Today's Date: 10/03/2014  Pain: no c/o Skilled Therapeutic Interventions/Progress Updates: Pt declined scheduled cooking session.  Jocee Kissick 10/03/2014, 3:01 PM

## 2014-10-03 NOTE — Progress Notes (Signed)
Occupational Therapy Session Note  Patient Details  Name: Jean Ward MRN: [id number] Date of Birth: 1933/08/19  Today's Date: 10/03/2014 OT Individual Time: [id number] and 1400-1500 OT Individual Time Calculation (min): 60 min and 60 min   Short Term Goals: Week 1:  OT Short Term Goal 1 (Week 1): Pt will perform tub transfer onto TTB with Min A in order to increase I with functional transfers. OT Short Term Goal 2 (Week 1): Pt will perform LB dressing with Min A in order to increase I in self care. OT Short Term Goal 3 (Week 1): Pt will perform bathing with Min A in shower in order to increase I with self care. OT Short Term Goal 4 (Week 1): Pt will perform UB dressing with set up A in order to increase I in self care OT Short Term Goal 5 (Week 1): Pt will perform toilet transfer onto standard height toilet with min A in order to increase I in functional transfers.  Skilled Therapeutic Interventions/Progress Updates:  Session 1 : Pt  seated in recliner chair with interpreter present during session. Pt with no c/o pain this session. Pt agreeable to shower this session. Pt ambulated with min A and use of RW to obtain clothing and items needed for B & D session. OT session with focus on ADL training - use of R UE for tasks, pt education, STS, dynamic standing, functional mobility, and functional transfers. Pt performed TTB <> with RW and steady assist. Bathing with Min A to wash B feet. Pt requiring verbal cues and assist for R heel to L knee for washing and threading of clothing for LB dressing. Pt with min A for dynamic standing balance with clothing management. Pt seated in recliner chair with quick release belt on awaiting next therapist upon exiting the room. Call bell and all other needed items within reach.   Session 2: Pt supine in bed upon entering the room with no c/o pain. Interpreter not present during session with phone app being utilized to assist in communication between pt and  therapist. OT session with focus on R hand FMC, R hand strength, and education. Pt attempted to unbutton 2 large buttons from dressing board with increased time and without success. Pt was able to zip , unzip, and tie shoe laces upon second attempt with use of R hand for tasks. OT educated pt on grip strength exercises with use of red, medium soft resistance x 4 reps of each exercise. Pt also attempting to place small manipulatives into peg board with increased time and requesting rest breaks secondary to hand fatigue. Pt able to place into board with 2/9 drops from R hand. Therapist handing pt pegs in order to be successful. Pt with increased drops when attempting to obtain pegs from dish with R hand. Pt requesting to be supine in bed with min A hand held to bed. Pt supine in bed, bed alarm on, and call bell within reach upon exiting the room.   Therapy Documentation Precautions:  Precautions Precautions: Fall Precaution Comments: R inattention Restrictions Weight Bearing Restrictions: No   See FIM for current functional status  Therapy/Group: Individual Therapy  Lowella Gripittman, Jhase Creppel L 10/03/2014, 12:41 PM

## 2014-10-04 ENCOUNTER — Inpatient Hospital Stay (HOSPITAL_COMMUNITY): Payer: Medicare Other | Admitting: Physical Therapy

## 2014-10-04 ENCOUNTER — Inpatient Hospital Stay (HOSPITAL_COMMUNITY): Payer: Medicare Other | Admitting: Occupational Therapy

## 2014-10-04 LAB — GLUCOSE, CAPILLARY
Glucose-Capillary: 93 mg/dL (ref 70–99)
Glucose-Capillary: 106 mg/dL — ABNORMAL HIGH (ref 70–99)
Glucose-Capillary: 95 mg/dL (ref 70–99)
Glucose-Capillary: 122 mg/dL — ABNORMAL HIGH (ref 70–99)

## 2014-10-04 LAB — URINE CULTURE

## 2014-10-04 LAB — CREATININE, SERUM
Creatinine, Ser: 0.73 mg/dL (ref 0.50–1.10)
GFR, EST NON AFRICAN AMERICAN: 80 mL/min — AB (ref >90)

## 2014-10-04 MED ORDER — SENNOSIDES-DOCUSATE SODIUM 8.6-50 MG PO TABS
2.0000 | ORAL_TABLET | Freq: Every day | ORAL | Status: DC
  Administered 2014-10-04 – 2014-10-09 (×6): 2 via ORAL
  Filled 2014-10-04 (×7): qty 2

## 2014-10-04 NOTE — Progress Notes (Signed)
Social Work [name], female   DOB: 1933/06/06, 80 y.o.   MRN: [id number] Spoke with daughter-Evelyn the plan now is she has gotten three weeks off of work to provide supervision to pt, then she will go [place] to be With her other daughter.  Will plan for this and have daughter come in and go through family education prior to pt's discharge.

## 2014-10-04 NOTE — Progress Notes (Signed)
Subjective/Complaints: [name] with history of DM type 2, hyperlipidemia who was admitted on 09/22/14 with slurred speech, right sided weakness and right facial droop. CT head negative for abnormality and she was treated with tPA with improvements. MRI/MRA brain done revealing widespread areas of restricted diffusion are seen throughout the brain, most notable in the LEFT paramedian pons, consistent with multiple areas of acute infarction question emboli. 2D echo with EF 55-60% and no wall abnormality. Carotid dopplers without significant ICA stenosis. ST evaluation done revealing mild dysarthria and intact cognitive linguistic function. TEE without thrombus but positive bubble study and severe non-mobile plaque in descending thoracic aorta and distal portion of aortic arch. BLE dopplers done and negative for DVT. On 10/07, she had worsening of right sided weakness with increase in pain and numbness RLE as well as decrease in vision right eye. Loop recorder placed by Dr. Johney FrameAllred on 10/09. She has continued to complain of left hip pain and X rays done revealing mild degenerative changes and no fracture  Pt smiling shows me how she moves Right hand  Difficult to get ROS secondary to language Objective: Vital Signs: Blood pressure 107/39, pulse 58, temperature 98 F (36.7 C), temperature source Oral, resp. rate 18, weight 78.2 kg (172 lb 6.4 oz), SpO2 94.00%. No results found. Results for orders placed during the hospital encounter of 09/27/14 (from the past 72 hour(s))  GLUCOSE, CAPILLARY     Status: Abnormal   Collection Time    10/01/14  6:41 AM      Result Value Ref Range   Glucose-Capillary 114 (*) 70 - 99 mg/dL   Comment 1 Notify RN    GLUCOSE, CAPILLARY     Status: Abnormal   Collection Time    10/01/14 12:23 PM      Result Value Ref Range   Glucose-Capillary 118 (*) 70 - 99 mg/dL  GLUCOSE, CAPILLARY     Status: Abnormal   Collection Time    10/01/14   4:37 PM      Result Value Ref Range   Glucose-Capillary 121 (*) 70 - 99 mg/dL  GLUCOSE, CAPILLARY     Status: Abnormal   Collection Time    10/01/14  8:53 PM      Result Value Ref Range   Glucose-Capillary 146 (*) 70 - 99 mg/dL  URINALYSIS, ROUTINE W REFLEX MICROSCOPIC     Status: Abnormal   Collection Time    10/02/14 12:33 AM      Result Value Ref Range   Color, Urine YELLOW  YELLOW   APPearance CLOUDY (*) CLEAR   Specific Gravity, Urine 1.014  1.005 - 1.030   pH 6.0  5.0 - 8.0   Glucose, UA 500 (*) NEGATIVE mg/dL   Hgb urine dipstick MODERATE (*) NEGATIVE   Bilirubin Urine NEGATIVE  NEGATIVE   Ketones, ur 15 (*) NEGATIVE mg/dL   Protein, ur NEGATIVE  NEGATIVE mg/dL   Urobilinogen, UA 1.0  0.0 - 1.0 mg/dL   Nitrite POSITIVE (*) NEGATIVE   Leukocytes, UA MODERATE (*) NEGATIVE  URINE MICROSCOPIC-ADD ON     Status: Abnormal   Collection Time    10/02/14 12:33 AM      Result Value Ref Range   Squamous Epithelial / LPF RARE  RARE   WBC, UA 21-50  <3 WBC/hpf   RBC / HPF 7-10  <3 RBC/hpf   Bacteria, UA MANY (*) RARE  URINE CULTURE     Status: None  Collection Time    10/02/14 12:36 AM      Result Value Ref Range   Specimen Description URINE, CLEAN CATCH     Special Requests NONE     Culture  Setup Time       Value: 10/02/2014 08:47     Performed at [facility] Count       Value: >=100,000 COLONIES/ML     Performed at [facility]   Culture       Value: ESCHERICHIA COLI     Performed at [facility]   Report Status PENDING    GLUCOSE, CAPILLARY     Status: Abnormal   Collection Time    10/02/14  6:48 AM      Result Value Ref Range   Glucose-Capillary 117 (*) 70 - 99 mg/dL  GLUCOSE, CAPILLARY     Status: Abnormal   Collection Time    10/02/14 11:27 AM      Result Value Ref Range   Glucose-Capillary 110 (*) 70 - 99 mg/dL  GLUCOSE, CAPILLARY     Status: Abnormal   Collection Time    10/02/14  4:53 PM      Result Value Ref Range    Glucose-Capillary 132 (*) 70 - 99 mg/dL  GLUCOSE, CAPILLARY     Status: Abnormal   Collection Time    10/02/14  9:19 PM      Result Value Ref Range   Glucose-Capillary 137 (*) 70 - 99 mg/dL  GLUCOSE, CAPILLARY     Status: Abnormal   Collection Time    10/03/14  6:53 AM      Result Value Ref Range   Glucose-Capillary 111 (*) 70 - 99 mg/dL  GLUCOSE, CAPILLARY     Status: Abnormal   Collection Time    10/03/14 11:25 AM      Result Value Ref Range   Glucose-Capillary 145 (*) 70 - 99 mg/dL  GLUCOSE, CAPILLARY     Status: Abnormal   Collection Time    10/03/14  4:15 PM      Result Value Ref Range   Glucose-Capillary 114 (*) 70 - 99 mg/dL  GLUCOSE, CAPILLARY     Status: Abnormal   Collection Time    10/03/14  9:03 PM      Result Value Ref Range   Glucose-Capillary 125 (*) 70 - 99 mg/dL     HEENT: normal Cardio: RRR and no murmur Resp: CTA B/L and unlabored GI: BS positive and NT,ND Extremity:  Pulses positive and No Edema Skin:   Intact and Loop recorder site without active bleeding + ecchymosis Neuro: Cranial Nerve II-XII normal, Abnormal Motor 4/5 R UE, 5/5 in LUE and BLE and Abnormal FMC Ataxic/ dec FMC Musc/Skel:  Neck tender Gen NAD   Assessment/Plan: 1. 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 which require 3+ hours per day of interdisciplinary therapy in a comprehensive inpatient rehab setting. Physiatrist is providing close team supervision and 24 hour management of active medical problems listed below. Physiatrist and rehab team continue to assess barriers to discharge/monitor patient progress toward functional and medical goals. Tent D/C 10/22 FIM: FIM - Bathing Bathing Steps Patient Completed: Chest;Right Arm;Left Arm;Abdomen;Front perineal area;Buttocks;Right upper leg;Left upper leg Bathing: 4: Min-Patient completes 8-9 48f 10 parts or 75+ percent  FIM - Upper Body Dressing/Undressing Upper body  dressing/undressing steps patient completed: Thread/unthread left sleeve of pullover shirt/dress;Put head through opening of pull over shirt/dress;Pull shirt  over trunk Upper body dressing/undressing: 4: Min-Patient completed 75 plus % of tasks FIM - Lower Body Dressing/Undressing Lower body dressing/undressing steps patient completed: Thread/unthread left pants leg;Thread/unthread left underwear leg;Pull underwear up/down;Pull pants up/down Lower body dressing/undressing: 2: Max-Patient completed 25-49% of tasks  FIM - Toileting Toileting steps completed by patient: Adjust clothing prior to toileting;Performs perineal hygiene Toileting Assistive Devices: Grab bar or rail for support Toileting: 3: Mod-Patient completed 2 of 3 steps  FIM - Diplomatic Services operational officerToilet Transfers Toilet Transfers Assistive Devices: Art gallery managerWalker Toilet Transfers: 4-To toilet/BSC: Min A (steadying Pt. > 75%);4-From toilet/BSC: Min A (steadying Pt. > 75%)  FIM - Bed/Chair Transfer Bed/Chair Transfer Assistive Devices: Arm rests;Walker;Orthosis (R hand orthosis) Bed/Chair Transfer: 0: Activity did not occur  FIM - Locomotion: Wheelchair Distance: 50 Locomotion: Wheelchair: 0: Activity did not occur FIM - Locomotion: Ambulation Locomotion: Ambulation Assistive Devices: Other (comment);Walker - Rolling Ambulation/Gait Assistance: 4: Min assist;4: Min guard Locomotion: Ambulation: 2: Travels 50 - 149 ft with minimal assistance (Pt.>75%)  Comprehension Comprehension Mode: Auditory Comprehension: 5-Understands basic 90% of the time/requires cueing < 10% of the time  Expression Expression Mode: Verbal Expression Assistive Devices: 6-Other (Comment) (Interpreter and hand gestures when not around. Speaks minimal English ) Expression: 5-Expresses basic 90% of the time/requires cueing < 10% of the time.  Social Interaction Social Interaction: 5-Interacts appropriately 90% of the time - Needs monitoring or encouragement for participation or  interaction.  Problem Solving Problem Solving: 5-Solves basic 90% of the time/requires cueing < 10% of the time  Memory Memory: 5-Requires cues to use assistive device  Medical Problem List and Plan:  1. 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  2. DVT Prophylaxis/Anticoagulation: Pharmaceutical: Lovenox  3. Pain Management: Tylenol or tramadol prn for hip/knee pain.  4. Mood: LCSW to follow for evaluation and support.  5. Neuropsych: This patient is capable of making decisions on her own behalf.  6. Skin/Wound Care: Pressure relief measures.  7. Fluids/Electrolytes/Nutrition: Monitor I/O. Encourage fluid intake.  8. Blood pressure: Monitor every 8 hours.  9. DM type 2: Hgb A1c-6.0. Will monitor with ac/hs checks. Continue trajenta with SSI for elevated BS.  10. Chronic back pain/Left hip contusion--question radiculopathy: Will add Voltaren gel tid to help with symptoms.  11.  Neck pain add kpad and cont analgesic cream 12.  UTI start Keflex pnd cx, ecoli LOS (Days) 7 A FACE TO FACE EVALUATION WAS PERFORMED  Jean Ward 10/04/2014, 6:00 AM

## 2014-10-04 NOTE — Progress Notes (Signed)
Patient has not had a BM in 3 days.  Went to talk to patient about it, with Marissa NestlePam Love, PA, and the patient says she normally goes every 3 days at home, but she hasnt been eating as much here as she does at home.  She doesn't want anything to make her go at this time (suppository).  Pam also ordered her to start on some stool softeners QHS.  Will continue to monitor.  Dani Gobbleeardon, Jasson Siegmann J, RN

## 2014-10-04 NOTE — Progress Notes (Addendum)
Physical Therapy Session Note  Patient Details  Name: Jean Ward MRN: [id number] Date of Birth: [birth year]  Today's Date: 10/04/2014 PT Individual Time: 1132-1202 and [id number] PT Individual Time Calculation (min): 30 min and 34 min  Short Term Goals: Week 1:  PT Short Term Goal 1 (Week 1): Pt will perform supine<>sit with mod I with HOB flat without bed rails. PT Short Term Goal 2 (Week 1): Pt will consistently transfer from bed<>w/c with supervision to min guard with 25% cueing. PT Short Term Goal 3 (Week 1): Pt will ambulate 100' with supervision using LRAD. PT Short Term Goal 4 (Week 1): Pt will negotiate 2 stairs with 2 rails and min A. PT Short Term Goal 5 (Week 1): Pt will negotiate curb step with LRAD and supervision.   Skilled Therapeutic Interventions/Progress Updates:    Treatment Session 1: Pt received seated in recliner; agreeable to therapy. Session focused on stair negotiation with single rail to simulate home entrance at daughter's home in [place]. Performed gait from pt room<>ortho gym with rolling walker, R hand orthosis, and supervision. Pt reports there are 4 stairs with 1 rail to enter daughter (in [place]) home but pt unable to recall which side rail is on. Therefore, pt negotiated 3 stairs x2 trials with single rail, step-to pattern, and min A. Initial trial performed laterally with bilat UE's at R rail. Per pt report of pain in lateral L knee with lateral stair negotiation, performed subsequent trial forward-facing with bilat UE's at L rail.   During final 10 minutes of session, pt notified this therapist that daughter who lives in [place] was able to get 3 weeks off work; therefore, pt will live with daughter in [place] for 3 weeks before traveling to [place] to move in with other daughter. CSW [place] and primary PT notified. Pt continues to require cueing for safe use of rolling walker with transfers. Added external aid (in Spanish with assist of  interpreter) to rolling walker to facilitate carryover of cues. Session ended in pt room, where pt was left seated in recliner with quick release belt in place for safety, lunch tray set up, and all needs within reach.  Treatment Session 2:  Pt received seated in recliner; agreeable to therapy. Session focused on facilitating postural control with dynamic standing. Performed gait 2x120' in controlled and home environments with rolling walker, R hand orthosis, and supervision to min guard. With dynamic standing, pt continues to demonstrate LOB to R side. Therefore, focused on bilat toe taps on 4.5" step for lateral weight shifting requiring min-mod A, manual facilitation of weight shift to L side. Transitioned to R toe taps without UE support to facilitate lateral weight shift to L side, single limb stance stability. In rehab apartment, performed supine>sit transfer with supervision, increased time; sit>supine with min A for RLE management. Pt equired cueing for use of visual aid on rolling walker with ~50% of sit<>stand transfers. Session ended in pt room, where pt was left semi reclined in bed with 3 rails up, bed alarm on, and all needs within reach.  Therapy Documentation Precautions:  Precautions Precautions: Fall Precaution Comments: R inattention Restrictions Weight Bearing Restrictions: No Pain: Pain Assessment Pain Assessment: No/denies pain Pain Score: 0-No pain Locomotion : Ambulation Ambulation/Gait Assistance: 5: Supervision;4: Min guard   See FIM for current functional status  Therapy/Group: Individual Therapy  Hobble, Lorenda IshiharaBlair A 10/04/2014, 12:07 PM

## 2014-10-04 NOTE — Progress Notes (Signed)
Physical Therapy Session Note  Patient Details  Name: Jean Ward MRN: [id number] Date of Birth: Jul 28, 1933  Today's Date: 10/04/2014 PT Individual Time: 0920-1020 PT Individual Time Calculation (min): 60 min   Short Term Goals: Week 1:  PT Short Term Goal 1 (Week 1): Pt will perform supine<>sit with mod I with HOB flat without bed rails. PT Short Term Goal 2 (Week 1): Pt will consistently transfer from bed<>w/c with supervision to min guard with 25% cueing. PT Short Term Goal 3 (Week 1): Pt will ambulate 100' with supervision using LRAD. PT Short Term Goal 4 (Week 1): Pt will negotiate 2 stairs with 2 rails and min A. PT Short Term Goal 5 (Week 1): Pt will negotiate curb step with LRAD and supervision.   Skilled Therapeutic Interventions/Progress Updates:   Pt received sitting in w/c, ready for therapy, interpreter present for session. Gait training x 110 ft using RW with R hand orthosis, pt requires supervision with min cues for keeping RW closer to body and safety with turns. Reviewed falls safety with patient who verbalized understanding of when to attempt to get up by herself vs calling EMS. Pt performed floor transfer with initial demonstration and step-by-step verbal cues for sequencing. Pt requires min A for getting down on mat and mod-max A to get back up. Pt negotiated up/down 5 stairs using 2 rails with min guard and continues to require verbal cues for sequencing. Gait training without AD and occasional HHA in controlled and home environments, min A and requiring up to mod A with LOB. Pt easily internally and externally distracted resulting in LOB as well as decreased weight shift to RLE and narrow BOS. During rest, pt educated to focus on mobility task and save conversations for resting due to tendency to talk with head turns during ambulation, causing pt to lose balance. Pt verbalized understanding. Pt performed sit <> stand on low compliant couch surface x 5 with verbal cues for  upright trunk and forward gaze in standing. Pt requires mod cues to completely turn while standing before sitting as she tends to sit sideways on surface with RLE extended. Pt reports she made all the beds in the house while the children were at school prior to hospitalization. Pt participated in task to take pillows/comforter off bed and re-make bed using BUE to challenge ambulation in home environment and dynamic standing balance. Pt requires min A for balance and cues to stand up tall vs leaning over to furniture walk. Pt completed task with increased time and 2 seated rest breaks. Pt ambulated back to room using RW with supervision and left sitting in recliner with quick release belt on and all needs within reach.   Therapy Documentation Precautions:  Precautions Precautions: Fall Precaution Comments: R inattention Restrictions Weight Bearing Restrictions: No Pain: Pain Assessment Pain Assessment: No/denies pain Pain Score: 0-No pain  See FIM for current functional status  Therapy/Group: Individual Therapy  Kerney ElbeVarner, Jean Insco A 10/04/2014, 10:32 AM

## 2014-10-05 ENCOUNTER — Inpatient Hospital Stay (HOSPITAL_COMMUNITY): Payer: Medicare Other | Admitting: Physical Therapy

## 2014-10-05 LAB — GLUCOSE, CAPILLARY
GLUCOSE-CAPILLARY: 125 mg/dL — AB (ref 70–99)
GLUCOSE-CAPILLARY: 150 mg/dL — AB (ref 70–99)
Glucose-Capillary: 111 mg/dL — ABNORMAL HIGH (ref 70–99)
Glucose-Capillary: 129 mg/dL — ABNORMAL HIGH (ref 70–99)

## 2014-10-05 NOTE — Progress Notes (Signed)
Physical Therapy Session Note  Patient Details  Name: Jean Ward MRN: [id number] Date of Birth: Aug 27, 1933  Today's Date: 10/05/2014 PT Individual Time: 0910-1015 PT Individual Time Calculation (min): 65 min   Short Term Goals: Week 1:  PT Short Term Goal 1 (Week 1): Pt will perform supine<>sit with mod I with HOB flat without bed rails. PT Short Term Goal 2 (Week 1): Pt will consistently transfer from bed<>w/c with supervision to min guard with 25% cueing. PT Short Term Goal 3 (Week 1): Pt will ambulate 100' with supervision using LRAD. PT Short Term Goal 4 (Week 1): Pt will negotiate 2 stairs with 2 rails and min A. PT Short Term Goal 5 (Week 1): Pt will negotiate curb step with LRAD and supervision.   Skilled Therapeutic Interventions/Progress Updates:   Pt received semi reclined in bed, daughter from [place] present for family training. Pt transferred supine > sit with HOB flat and no rail with difficulty sequencing and min-mod A. Pt ambulated to sink to brush hair using RUE before ambulating room > ADL apartment using RW with R hand orthosis and close supervision. Pt performed bed mobility on R side of bed to simulate home setup with close supervision and verbal cues for technique with sit > supine and min A progressed to supervision with supine > sit with verbal cues for sequencing. Pt requires cues for hand placement with sit <> stand from bed even after reading aloud external aid placed on RW by previous therapist for technique. Gait training using RW, multiple bouts throughout rehab unit up to 120 ft with daughter providing supervision and no LOB.  Pt negotiated up/down 5 stairs using 2 rails forwards with supervision and mod verbal cues for sequencing to simulate [place] entry and up/down 5 stairs using R rail ascending with min A and min cues for sequencing to simulate [place] entry. Daughter providing appropriate verbal cues and therapist providing physical assist. Pt  negotiated obstacle course with and without RW to include compliant surfaces, over thresholds, change in direction, and obstacle negotiation with supervision-min A. Patient picked cones up from floor with verbal cues for technique to bend with knees and not with back, min-mod A. Pt performed car transfer to SUV height with min A for scooting back on seat before bringing legs in and for lifting RLE into car. Pt requires mod A to scoot hips back in seat once in car for upright posture as she tended to push posteriorly with hips sliding forward. Pt ambulated back to room using RW with supervision and left sitting on toilet with daughter present.    Therapy Documentation Precautions:  Precautions Precautions: Fall Precaution Comments: R inattention Restrictions Weight Bearing Restrictions: No Pain: Pain Assessment Pain Assessment: No/denies pain  See FIM for current functional status  Therapy/Group: Individual Therapy  Kerney ElbeVarner, Rocco Kerkhoff A 10/05/2014, 12:21 PM

## 2014-10-05 NOTE — Progress Notes (Addendum)
[name], female   DOB: 12-31-1932, 80 y.o.   MRN: [id number]   Subjective/Complaints: 80 y.o. RH- female ([place] descent) with history of DM type 2, hyperlipidemia who was admitted on 09/22/14 with slurred speech, right sided weakness and right facial droop.  Pt smiling shows me how she moves Right hand  Difficult to get ROS secondary to language Objective: Vital Signs: Blood pressure 128/50, pulse 71, temperature 98.9 F (37.2 C), temperature source Oral, resp. rate 19, weight 78.2 kg (172 lb 6.4 oz), SpO2 100.00%. No results found. Results for orders placed during the hospital encounter of 09/27/14 (from the past 72 hour(s))  GLUCOSE, CAPILLARY     Status: Abnormal   Collection Time    10/02/14 11:27 AM      Result Value Ref Range   Glucose-Capillary 110 (*) 70 - 99 mg/dL  GLUCOSE, CAPILLARY     Status: Abnormal   Collection Time    10/02/14  4:53 PM      Result Value Ref Range   Glucose-Capillary 132 (*) 70 - 99 mg/dL  GLUCOSE, CAPILLARY     Status: Abnormal   Collection Time    10/02/14  9:19 PM      Result Value Ref Range   Glucose-Capillary 137 (*) 70 - 99 mg/dL  GLUCOSE, CAPILLARY     Status: Abnormal   Collection Time    10/03/14  6:53 AM      Result Value Ref Range   Glucose-Capillary 111 (*) 70 - 99 mg/dL  GLUCOSE, CAPILLARY     Status: Abnormal   Collection Time    10/03/14 11:25 AM      Result Value Ref Range   Glucose-Capillary 145 (*) 70 - 99 mg/dL  GLUCOSE, CAPILLARY     Status: Abnormal   Collection Time    10/03/14  4:15 PM      Result Value Ref Range   Glucose-Capillary 114 (*) 70 - 99 mg/dL  GLUCOSE, CAPILLARY     Status: Abnormal   Collection Time    10/03/14  9:03 PM      Result Value Ref Range   Glucose-Capillary 125 (*) 70 - 99 mg/dL  CREATININE, SERUM     Status: Abnormal   Collection Time    10/04/14  6:35 AM      Result Value Ref Range   Creatinine, Ser 0.73  0.50 - 1.10 mg/dL   GFR calc non Af Amer 80 (*) >90 mL/min    GFR calc Af Amer >90  >90 mL/min   Comment: (NOTE)     The eGFR has been calculated using the CKD EPI equation.     This calculation has not been validated in all clinical situations.     eGFR's persistently <90 mL/min signify possible Chronic Kidney     Disease.  GLUCOSE, CAPILLARY     Status: None   Collection Time    10/04/14  6:48 AM      Result Value Ref Range   Glucose-Capillary 93  70 - 99 mg/dL  GLUCOSE, CAPILLARY     Status: Abnormal   Collection Time    10/04/14 11:35 AM      Result Value Ref Range   Glucose-Capillary 106 (*) 70 - 99 mg/dL   Comment 1 Notify RN    GLUCOSE, CAPILLARY     Status: None   Collection Time    10/04/14  4:25 PM      Result Value Ref Range   Glucose-Capillary 95  70 - 99 mg/dL   Comment 1 Notify RN    GLUCOSE, CAPILLARY     Status: Abnormal   Collection Time    10/04/14  8:49 PM      Result Value Ref Range   Glucose-Capillary 122 (*) 70 - 99 mg/dL     HEENT: normal Cardio: RRR and no murmur Resp: CTA B/L and unlabored GI: BS positive and NT,ND Extremity:  Pulses positive and No Edema Skin:   Intact and Loop recorder site without active bleeding + ecchymosis Neuro: Cranial Nerve II-XII normal, Abnormal Motor 4/5 R UE, 5/5 in LUE and BLE and Abnormal FMC Ataxic/ dec FMC Musc/Skel:  Neck tender Gen NAD   Assessment/Plan: 1. 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 which require 3+ hours per day of interdisciplinary therapy in a comprehensive inpatient rehab setting. Physiatrist is providing close team supervision and 24 hour management of active medical problems listed below. Physiatrist and rehab team continue to assess barriers to discharge/monitor patient progress toward functional and medical goals. Tent D/C 10/22 FIM: FIM - Bathing Bathing Steps Patient Completed: Chest;Right Arm;Left Arm;Abdomen;Front perineal area;Buttocks;Right upper leg;Left upper leg Bathing: 4:  Min-Patient completes 8-9 [id number] parts or 75+ percent  FIM - Upper Body Dressing/Undressing Upper body dressing/undressing steps patient completed: Thread/unthread right bra strap;Thread/unthread left bra strap;Thread/unthread right sleeve of pullover shirt/dresss;Thread/unthread left sleeve of pullover shirt/dress;Put head through opening of pull over shirt/dress;Pull shirt over trunk Upper body dressing/undressing: 4: Min-Patient completed 75 plus % of tasks FIM - Lower Body Dressing/Undressing Lower body dressing/undressing steps patient completed: Thread/unthread left underwear leg;Pull underwear up/down;Thread/unthread right pants leg;Thread/unthread left pants leg;Pull pants up/down;Don/Doff left shoe Lower body dressing/undressing: 3: Mod-Patient completed 50-74% of tasks  FIM - Toileting Toileting steps completed by patient: Adjust clothing prior to toileting;Performs perineal hygiene;Adjust clothing after toileting Toileting Assistive Devices: Grab bar or rail for support Toileting: 4: Steadying assist  FIM - TRadio producerDevices: WInsurance account managerTransfers: 4-To toilet/BSC: Min A (steadying Pt. > 75%);4-From toilet/BSC: Min A (steadying Pt. > 75%)  FIM - Bed/Chair Transfer Bed/Chair Transfer Assistive Devices: Arm rests;Walker;Orthosis (R orthosis) Bed/Chair Transfer: 5: Chair or W/C > Bed: Supervision (verbal cues/safety issues);5: Bed > Chair or W/C: Supervision (verbal cues/safety issues);5: Supine > Sit: Supervision (verbal cues/safety issues);4: Sit > Supine: Min A (steadying pt. > 75%/lift 1 leg)  FIM - Locomotion: Wheelchair Distance: 50 Locomotion: Wheelchair: 0: Activity did not occur FIM - Locomotion: Ambulation Locomotion: Ambulation Assistive Devices: Other (comment);Walker - Rolling;Orthosis (R hand orthosis) Ambulation/Gait Assistance: 5: Supervision;4: Min guard Locomotion: Ambulation: 2: Travels 50 - 149 ft with minimal assistance  (Pt.>75%)  Comprehension Comprehension Mode: Auditory Comprehension: 5-Understands basic 90% of the time/requires cueing < 10% of the time  Expression Expression Mode: Verbal Expression Assistive Devices: 6-Other (Comment) (interpreter) Expression: 5-Expresses basic 90% of the time/requires cueing < 10% of the time.  Social Interaction Social Interaction: 6-Interacts appropriately with others with medication or extra time (anti-anxiety, antidepressant).  Problem Solving Problem Solving: 5-Solves basic 90% of the time/requires cueing < 10% of the time  Memory Memory: 5-Requires cues to use assistive device  Medical Problem List and Plan:  1. 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  2. DVT Prophylaxis/Anticoagulation: Pharmaceutical: Lovenox  3. Pain Management: Tylenol or tramadol prn for hip/knee pain.  4. Mood: LCSW to follow for evaluation and support.  5. Neuropsych: This patient is capable of making decisions  on her own behalf.  6. Skin/Wound Care: Pressure relief measures.  7. Fluids/Electrolytes/Nutrition: Monitor I/O. Encourage fluid intake.  8. Blood pressure: Monitor every 8 hours.  9. DM type 2: Hgb A1c-6.0. Will monitor with ac/hs checks. Continue trajenta with SSI for elevated BS.  10. Chronic back pain/Left hip contusion--question radiculopathy: Will add Voltaren gel tid to help with symptoms.  11.  Neck pain add kpad and cont analgesic cream 12.  UTI sens Keflex pnd cx, ecoli LOS (Days) 8 A FACE TO FACE EVALUATION WAS PERFORMED  KIRSTEINS,ANDREW E 10/05/2014, 7:00 AM

## 2014-10-06 ENCOUNTER — Inpatient Hospital Stay (HOSPITAL_COMMUNITY): Payer: Medicare Other | Admitting: Rehabilitation

## 2014-10-06 LAB — GLUCOSE, CAPILLARY
GLUCOSE-CAPILLARY: 114 mg/dL — AB (ref 70–99)
GLUCOSE-CAPILLARY: 114 mg/dL — AB (ref 70–99)
Glucose-Capillary: 119 mg/dL — ABNORMAL HIGH (ref 70–99)
Glucose-Capillary: 107 mg/dL — ABNORMAL HIGH (ref 70–99)

## 2014-10-06 NOTE — Progress Notes (Signed)
[name], female   DOB: 1933/09/22, 80 y.o.   MRN: [id number]   Subjective/Complaints: 80 y.o. RH- female ([place] descent) with history of DM type 2, hyperlipidemia who was admitted on 09/22/14 with slurred speech, right sided weakness and right facial droop.  Pt smiling shows me how she moves Right hand  Difficult to get ROS secondary to language Objective: Vital Signs: Blood pressure 120/50, pulse 63, temperature 98.3 F (36.8 C), temperature source Oral, resp. rate 18, weight 78.2 kg (172 lb 6.4 oz), SpO2 100.00%. No results found. Results for orders placed during the hospital encounter of 09/27/14 (from the past 72 hour(s))  GLUCOSE, CAPILLARY     Status: Abnormal   Collection Time    10/03/14 11:25 AM      Result Value Ref Range   Glucose-Capillary 145 (*) 70 - 99 mg/dL  GLUCOSE, CAPILLARY     Status: Abnormal   Collection Time    10/03/14  4:15 PM      Result Value Ref Range   Glucose-Capillary 114 (*) 70 - 99 mg/dL  GLUCOSE, CAPILLARY     Status: Abnormal   Collection Time    10/03/14  9:03 PM      Result Value Ref Range   Glucose-Capillary 125 (*) 70 - 99 mg/dL  CREATININE, SERUM     Status: Abnormal   Collection Time    10/04/14  6:35 AM      Result Value Ref Range   Creatinine, Ser 0.73  0.50 - 1.10 mg/dL   GFR calc non Af Amer 80 (*) >90 mL/min   GFR calc Af Amer >90  >90 mL/min   Comment: (NOTE)     The eGFR has been calculated using the CKD EPI equation.     This calculation has not been validated in all clinical situations.     eGFR's persistently <90 mL/min signify possible Chronic Kidney     Disease.  GLUCOSE, CAPILLARY     Status: None   Collection Time    10/04/14  6:48 AM      Result Value Ref Range   Glucose-Capillary 93  70 - 99 mg/dL  GLUCOSE, CAPILLARY     Status: Abnormal   Collection Time    10/04/14 11:35 AM      Result Value Ref Range   Glucose-Capillary 106 (*) 70 - 99 mg/dL   Comment 1 Notify RN    GLUCOSE, CAPILLARY      Status: None   Collection Time    10/04/14  4:25 PM      Result Value Ref Range   Glucose-Capillary 95  70 - 99 mg/dL   Comment 1 Notify RN    GLUCOSE, CAPILLARY     Status: Abnormal   Collection Time    10/04/14  8:49 PM      Result Value Ref Range   Glucose-Capillary 122 (*) 70 - 99 mg/dL  GLUCOSE, CAPILLARY     Status: Abnormal   Collection Time    10/05/14  7:10 AM      Result Value Ref Range   Glucose-Capillary 125 (*) 70 - 99 mg/dL   Comment 1 Notify RN    GLUCOSE, CAPILLARY     Status: Abnormal   Collection Time    10/05/14 11:22 AM      Result Value Ref Range   Glucose-Capillary 150 (*) 70 - 99 mg/dL   Comment 1 Notify RN    GLUCOSE, CAPILLARY     Status: Abnormal  Collection Time    10/05/14  4:23 PM      Result Value Ref Range   Glucose-Capillary 111 (*) 70 - 99 mg/dL   Comment 1 Notify RN    GLUCOSE, CAPILLARY     Status: Abnormal   Collection Time    10/05/14  8:47 PM      Result Value Ref Range   Glucose-Capillary 129 (*) 70 - 99 mg/dL  GLUCOSE, CAPILLARY     Status: Abnormal   Collection Time    10/06/14  6:45 AM      Result Value Ref Range   Glucose-Capillary 114 (*) 70 - 99 mg/dL     HEENT: normal Cardio: RRR and no murmur Resp: CTA B/L and unlabored GI: BS positive and NT,ND Extremity:  Pulses positive and No Edema Skin:   Intact and Loop recorder site without active bleeding + ecchymosis Neuro: Cranial Nerve II-XII normal, Abnormal Motor 4/5 R UE, 5/5 in LUE and BLE and Abnormal FMC Ataxic/ dec FMC Musc/Skel:  Neck tender Gen NAD   Assessment/Plan: 1. 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 which require 3+ hours per day of interdisciplinary therapy in a comprehensive inpatient rehab setting. Physiatrist is providing close team supervision and 24 hour management of active medical problems listed below. Physiatrist and rehab team continue to assess barriers to discharge/monitor patient  progress toward functional and medical goals. Tent D/C 10/22 FIM: FIM - Bathing Bathing Steps Patient Completed: Chest;Right Arm;Left Arm;Abdomen;Front perineal area;Buttocks;Right upper leg;Left upper leg Bathing: 4: Min-Patient completes 8-9 13f10 parts or 75+ percent  FIM - Upper Body Dressing/Undressing Upper body dressing/undressing steps patient completed: Thread/unthread right bra strap;Thread/unthread left bra strap;Thread/unthread right sleeve of pullover shirt/dresss;Thread/unthread left sleeve of pullover shirt/dress;Put head through opening of pull over shirt/dress;Pull shirt over trunk Upper body dressing/undressing: 4: Min-Patient completed 75 plus % of tasks FIM - Lower Body Dressing/Undressing Lower body dressing/undressing steps patient completed: Thread/unthread left underwear leg;Pull underwear up/down;Thread/unthread right pants leg;Thread/unthread left pants leg;Pull pants up/down;Don/Doff left shoe Lower body dressing/undressing: 3: Mod-Patient completed 50-74% of tasks  FIM - Toileting Toileting steps completed by patient: Adjust clothing prior to toileting;Performs perineal hygiene;Adjust clothing after toileting Toileting Assistive Devices: Grab bar or rail for support Toileting: 4: Steadying assist  FIM - TRadio producerDevices: Grab bars;Walker Toilet Transfers: 4-To toilet/BSC: Min A (steadying Pt. > 75%);4-From toilet/BSC: Min A (steadying Pt. > 75%)  FIM - Bed/Chair Transfer Bed/Chair Transfer Assistive Devices: Arm rests;Walker Bed/Chair Transfer: 5: Chair or W/C > Bed: Supervision (verbal cues/safety issues);5: Bed > Chair or W/C: Supervision (verbal cues/safety issues);4: Supine > Sit: Min A (steadying Pt. > 75%/lift 1 leg);3: Sit > Supine: Mod A (lifting assist/Pt. 50-74%/lift 2 legs)  FIM - Locomotion: Wheelchair Distance: 50 Locomotion: Wheelchair: 0: Activity did not occur FIM - Locomotion: Ambulation Locomotion:  Ambulation Assistive Devices: Other (comment);Walker - Rolling;Orthosis Ambulation/Gait Assistance: 5: Supervision Locomotion: Ambulation: 2: Travels 50 - 149 ft with supervision/safety issues  Comprehension Comprehension Mode: Auditory Comprehension: 5-Understands basic 90% of the time/requires cueing < 10% of the time  Expression Expression Mode: Verbal Expression Assistive Devices: 6-Other (Comment) (interpreter) Expression: 5-Expresses basic 90% of the time/requires cueing < 10% of the time.  Social Interaction Social Interaction: 6-Interacts appropriately with others with medication or extra time (anti-anxiety, antidepressant).  Problem Solving Problem Solving: 5-Solves basic 90% of the time/requires cueing < 10% of the time  Memory Memory: 5-Recognizes or recalls 90% of the  time/requires cueing < 10% of the time  Medical Problem List and Plan:  1. 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  2. DVT Prophylaxis/Anticoagulation: Pharmaceutical: Lovenox  3. Pain Management: Tylenol or tramadol prn for hip/knee pain.  4. Mood: LCSW to follow for evaluation and support.  5. Neuropsych: This patient is capable of making decisions on her own behalf.  6. Skin/Wound Care: Pressure relief measures.  7. Fluids/Electrolytes/Nutrition: Monitor I/O. Encourage fluid intake.  8. Blood pressure: Monitor every 8 hours.  9. DM type 2: Hgb A1c-6.0. Will monitor with ac/hs checks. Continue trajenta with SSI for elevated BS.  10. Chronic back pain/Left hip contusion, improved with conservative care 11.  Neck pain resolved 12.  UTI sens Keflex , ecoli LOS (Days) 9 A FACE TO FACE EVALUATION WAS PERFORMED  Cranford Blessinger E 10/06/2014, 7:01 AM

## 2014-10-06 NOTE — Progress Notes (Signed)
Physical Therapy Weekly Progress Note  Patient Details  Name: Jean Ward MRN: [id number] Date of Birth: 1933-05-06  Beginning of progress report period: September 28, 2014 End of progress report period: October 06, 2014  Today's Date: 10/06/2014 PT Individual Time: 0800-0900 PT Individual Time Calculation (min): 60 min   Patient has met 3 of 5 short term goals.  Pt continues to make steady progress towards all goals in therapy.  She continues to require min to mod A cues for safety, esp during gait and stairs.  Continue to feel that she will need 24/7 S at time of D/C due to safety concerns and continued risk for falling.    Patient continues to demonstrate the following deficits: decreased balance, decreased sensation in RUE/LE, decreased strength in RUE/LE, decreased awareness, decreased safety and therefore will continue to benefit from skilled PT intervention to enhance overall performance with activity tolerance, balance, postural control, ability to compensate for deficits, functional use of  right upper extremity and right lower extremity, attention, awareness, coordination and knowledge of precautions.  Patient progressing toward long term goals..  Continue plan of care.  PT Short Term Goals Week 1:  PT Short Term Goal 1 (Week 1): Pt will perform supine<>sit with mod I with HOB flat without bed rails. PT Short Term Goal 1 - Progress (Week 1): Met PT Short Term Goal 2 (Week 1): Pt will consistently transfer from bed<>w/c with supervision to min guard with 25% cueing. PT Short Term Goal 2 - Progress (Week 1): Met PT Short Term Goal 3 (Week 1): Pt will ambulate 100' with supervision using LRAD. PT Short Term Goal 3 - Progress (Week 1): Progressing toward goal PT Short Term Goal 4 (Week 1): Pt will negotiate 2 stairs with 2 rails and min A. PT Short Term Goal 4 - Progress (Week 1): Met PT Short Term Goal 5 (Week 1): Pt will negotiate curb step with LRAD and supervision.  PT Short  Term Goal 5 - Progress (Week 1): Progressing toward goal Week 2:  PT Short Term Goal 1 (Week 2): =LTG's due to ELOS  Skilled Therapeutic Interventions/Progress Updates:   Pt received sitting in recliner in room, agreeable to therapy session.  Assisted with donning TEDs and shoes prior to leaving room.  Pt then motions that she wants to don clothing prior to leaving room.  Provided pt with undergarments and pants/shirt.  Pt able to don underwear and pants on her own and stand at S level in order to finish pulling up, however requires mod cues to look in mirror to finish pulling up on R side.  Also requires assist for threading R arm first when donning shirt, as she was attempting to do L arm them pull over head.  Pt able to button all buttons on shirt at S level with min cues for attending to R arm to assist.  Pt then ambulated to sink to brush hair.  Ambulated to/from therapy gym 100' x 2 reps with RW at close S level.  Pt with good recall of pushing up from chair and then addressing R hand to splint, however then requires cues for removing RUE from splint prior to sitting.  Performed obstacle course 2 reps of 25' negotiating over poles and wedges and around cones to simulate tight spaces at home and for increased foot clearance.  Pt requires mod to max verbal and demonstration cues to perform correctly.  Progressed to seated nustep x 6 mins at level 3 resistance with BUEs/LEs  in order to address NMR to RLE, overall strengthening, endurance and attention to R hand.  Pt tolerated well.  Ended session with gait back to room as stated above.  Left in recliner with all needs in reach.  Pt attempting to communicate regarding her head.  Asked if she felt dizzy and it seemed as though she was attempting to state she feels dizzy when she is upright.  Checked BP in R arm and was 130/80, which is higher than usual.  RN made aware.  Left pt in recliner with quick release belt and all needs in reach.   Therapy  Documentation Precautions:  Precautions Precautions: Fall Precaution Comments: R inattention Restrictions Weight Bearing Restrictions: No   Vital Signs: Therapy Vitals Temp: 98.3 F (36.8 C) Temp Source: Oral Pulse Rate: 63 Resp: 18 BP: 120/50 mmHg Patient Position (if appropriate): Lying Pain: Pt with some c/o facial pain (pointed to face).  Unsure if she meant headache or not, allowed multiple rest breaks during session.      See FIM for current functional status  Therapy/Group: Individual Therapy  Denice Bors 10/06/2014, 7:38 AM

## 2014-10-07 ENCOUNTER — Inpatient Hospital Stay (HOSPITAL_COMMUNITY): Payer: Medicare Other

## 2014-10-07 ENCOUNTER — Ambulatory Visit: Payer: Medicare Other

## 2014-10-07 ENCOUNTER — Inpatient Hospital Stay (HOSPITAL_COMMUNITY): Payer: Medicare Other | Admitting: Rehabilitation

## 2014-10-07 ENCOUNTER — Inpatient Hospital Stay (HOSPITAL_COMMUNITY): Payer: Medicare Other | Admitting: Physical Therapy

## 2014-10-07 LAB — GLUCOSE, CAPILLARY
GLUCOSE-CAPILLARY: 128 mg/dL — AB (ref 70–99)
Glucose-Capillary: 107 mg/dL — ABNORMAL HIGH (ref 70–99)
Glucose-Capillary: 103 mg/dL — ABNORMAL HIGH (ref 70–99)
Glucose-Capillary: 132 mg/dL — ABNORMAL HIGH (ref 70–99)

## 2014-10-07 NOTE — Progress Notes (Signed)
Physical Therapy Session Note  Patient Details  Name: Jean Ward MRN: [id number] Date of Birth: [birth year]  Today's Date: 10/07/2014 PT Individual Time: 0830-0915 PT Individual Time Calculation (min): 45 min   Short Term Goals: Week 2:  PT Short Term Goal 1 (Week 2): =LTG's due to ELOS  Skilled Therapeutic Interventions/Progress Updates:   Pt received sitting in recliner in room.  She initially wanted to get dressed prior to session, but then stated that she felt "dirty" and didn't want to put clean clothes on.  Discussed that she would have an OT session for bathing/dressing later in the morning, however could not find her schedule to state what time and whom.  Pt then agreeable to wear another gown for modesty and ambulated to/from therapy gym with RW and R hand splint at S level with min cues for upright posture and continued cues for safe hand placement when sitting/standing.  Once in therapy gym, looked up pts schedule and noted OT to be female.  Pt not comfortable with this, therefore PT offered to assist with shower and dressing to address safety and balance during session.  Pt agreeable and ambulated back to room.  Min A into restroom with RW to tub shower bench with cues for keeping RW with her until seated then moving aside.  Pt able to wash all areas of body except for back with steadying assist to stand with use of grab bar for safety.  Pt then donned all clothing with assist for right side of pants and for pulling shirt over back.  Pt ambulated to sink to brush hair.  Left in recliner with all needs in reach.  Homero FellersFrank with OT notified that pt had showered.    Therapy Documentation Precautions:  Precautions Precautions: Fall Precaution Comments: R inattention Restrictions Weight Bearing Restrictions: No   Pain: Pain Assessment Pain Assessment: No/denies pain   Locomotion : Ambulation Ambulation/Gait Assistance: 5: Supervision   See FIM for current functional  status  Therapy/Group: Individual Therapy  Vista Deckarcell, Karron Alvizo Ann 10/07/2014, 12:14 PM

## 2014-10-07 NOTE — Progress Notes (Signed)
Physical Therapy Session Note  Patient Details  Name: Jean Ward MRN: [id number] Date of Birth: 1933-08-30  Today's Date: 10/07/2014 PT Individual Time: 1315-1400 PT Individual Time Calculation (min): 45 min   Short Term Goals: Week 2:  PT Short Term Goal 1 (Week 2): =LTG's due to ELOS  Skilled Therapeutic Interventions/Progress Updates:   Pt received sitting in recliner, agreeable to earlier PT session.  Pt ambulated to/from therapy gym without AD to further challenge balance and increase RLE WB at min A level with R HHA.  Min cues for upright posture and increased stride length.  Once in therapy gym, focused on balance and NMR for RUE/LE.  Performed quadruped (with knees on foam pad for comfort) while using LUE to reach over fixed RUE while placing clothes pins on tall metal pole.  Also worked on advancing and retro placing LUE while remaining fixed on RUE. Therapist assisting to keep R elbow in extension.  Tolerated well, however requested "no mas."  Progressed to performing standing on foam pad while reaching side to side and placing yellow clothes pins with RUE to tall metal pole.  Pt demonstrating approx 80 deg of active R shoulder flex. Slight HOH assist to keep clothes pin pinched.  Pt continues to demonstrate little ankle and hip strategy when overt LOB, therefore requires min to mod A to correct.  Then performed seated nustep x 8 mins at level 4 resistance with BLEs only to increase NMR to RLE and overall endurance.  Tolerated well with single rest break.  Ambulated back to room as stated above.  Left in recliner with all needs in reach.  Attempted to leave without quick release belt, however pt pointing to head, stating she might forget and go to bathroom, and put belt back on.  RN aware.   Therapy Documentation Precautions:  Precautions Precautions: Fall Precaution Comments: R inattention Restrictions Weight Bearing Restrictions: No   Pain: Pain Assessment Pain Assessment:  No/denies pain   Locomotion : Ambulation Ambulation/Gait Assistance: 5: Supervision (Simultaneous filing. User may not have seen previous data.)   See FIM for current functional status  Therapy/Group: Individual Therapy  Vista Deckarcell, Kareli Hossain Ann 10/07/2014, 1:44 PM

## 2014-10-07 NOTE — Progress Notes (Signed)
Occupational Therapy Session Note  Patient Details  Name: Jean Ward MRN: [id number] Date of Birth: 09/30/1933  Today's Date: 10/07/2014 OT Individual Time: 0915-1020 OT Individual Time Calculation (min): 65 min    Short Term Goals: Week 2:  OT Short Term Goal 1 (Week 2): STG=LTGs secondary to expected discharge date  Skilled Therapeutic Interventions/Progress Updates: Therapeutic exercises with emphasis on improved right grip/pinch strength using theraputty (yellow, soft) and right UE strength using theraband (orange, level 1 resistance).   Pt required setup assist to don TEDs and then engaged in UE strengthening versus planned activity (bathing and dressing) d/t having completed bath/dress with physical therapist.    Grip strength was assessed using Jamar hand dyno as follows: L=35 lbs, R= 2 lbs.    Pinch test completed for right hand: Key = 3 lbs, Palmar = 2 lbs, Tip = 1 lbs. Pt performed 6 exercises for grip/pinch (illustrated pt ed and demonstrated) and 2 arm exercises (bicep curl and tricep extension).      Pt demo'd good attention to task but required hand guidance to perform all exercises correctly.   Spanish language interpreter was present throughout session to assist with instruction.    Pt left seated in recliner with interpreter present for safety.    Therapy Documentation Precautions:  Precautions Precautions: Fall Precaution Comments: R inattention Restrictions Weight Bearing Restrictions: No  Vital Signs: Therapy Vitals Temp: 97.8 F (36.6 C) Temp Source: Oral Pulse Rate: 79 Resp: 16 BP: 141/52 mmHg Patient Position (if appropriate): Sitting Oxygen Therapy SpO2: 97 %  Pain: Pain Assessment Pain Assessment: No/denies pain  See FIM for current functional status  Therapy/Group: Individual Therapy  Jean Ward 10/07/2014, 2:39 PM

## 2014-10-07 NOTE — Progress Notes (Signed)
[name], female   DOB: 18-Apr-1933, 80 y.o.   MRN: [id number]   Subjective/Complaints: [name] with history of DM type 2, hyperlipidemia who was admitted on 09/22/14 with slurred speech, right sided weakness and right facial droop.  "thursday home"   Difficult to get ROS secondary to language Objective: Vital Signs: Blood pressure 130/52, pulse 67, temperature 98 F (36.7 C), temperature source Oral, resp. rate 17, weight 78.2 kg (172 lb 6.4 oz), SpO2 97.00%. No results found. Results for orders placed during the hospital encounter of 09/27/14 (from the past 72 hour(s))  GLUCOSE, CAPILLARY     Status: Abnormal   Collection Time    10/04/14 11:35 AM      Result Value Ref Range   Glucose-Capillary 106 (*) 70 - 99 mg/dL   Comment 1 Notify RN    GLUCOSE, CAPILLARY     Status: None   Collection Time    10/04/14  4:25 PM      Result Value Ref Range   Glucose-Capillary 95  70 - 99 mg/dL   Comment 1 Notify RN    GLUCOSE, CAPILLARY     Status: Abnormal   Collection Time    10/04/14  8:49 PM      Result Value Ref Range   Glucose-Capillary 122 (*) 70 - 99 mg/dL  GLUCOSE, CAPILLARY     Status: Abnormal   Collection Time    10/05/14  7:10 AM      Result Value Ref Range   Glucose-Capillary 125 (*) 70 - 99 mg/dL   Comment 1 Notify RN    GLUCOSE, CAPILLARY     Status: Abnormal   Collection Time    10/05/14 11:22 AM      Result Value Ref Range   Glucose-Capillary 150 (*) 70 - 99 mg/dL   Comment 1 Notify RN    GLUCOSE, CAPILLARY     Status: Abnormal   Collection Time    10/05/14  4:23 PM      Result Value Ref Range   Glucose-Capillary 111 (*) 70 - 99 mg/dL   Comment 1 Notify RN    GLUCOSE, CAPILLARY     Status: Abnormal   Collection Time    10/05/14  8:47 PM      Result Value Ref Range   Glucose-Capillary 129 (*) 70 - 99 mg/dL  GLUCOSE, CAPILLARY     Status: Abnormal   Collection Time    10/06/14  6:45 AM      Result Value Ref  Range   Glucose-Capillary 114 (*) 70 - 99 mg/dL  GLUCOSE, CAPILLARY     Status: Abnormal   Collection Time    10/06/14 11:19 AM      Result Value Ref Range   Glucose-Capillary 119 (*) 70 - 99 mg/dL  GLUCOSE, CAPILLARY     Status: Abnormal   Collection Time    10/06/14  4:26 PM      Result Value Ref Range   Glucose-Capillary 107 (*) 70 - 99 mg/dL  GLUCOSE, CAPILLARY     Status: Abnormal   Collection Time    10/06/14  8:47 PM      Result Value Ref Range   Glucose-Capillary 114 (*) 70 - 99 mg/dL  GLUCOSE, CAPILLARY     Status: Abnormal   Collection Time    10/07/14  6:46 AM      Result Value Ref Range   Glucose-Capillary 128 (*) 70 - 99 mg/dL  HEENT: normal Cardio: RRR and no murmur Resp: CTA B/L and unlabored GI: BS positive and NT,ND Extremity:  Pulses positive and No Edema Skin:   Intact and Loop recorder site without active bleeding + ecchymosis Neuro: Cranial Nerve II-XII normal, Abnormal Motor 4/5 R UE, 5/5 in LUE and BLE and Abnormal FMC Ataxic/ dec FMC Musc/Skel:  Neck tender Gen NAD Mood/affect bright , smiling  Assessment/Plan: 1. 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 which require 3+ hours per day of interdisciplinary therapy in a comprehensive inpatient rehab setting. Physiatrist is providing close team supervision and 24 hour management of active medical problems listed below. Physiatrist and rehab team continue to assess barriers to discharge/monitor patient progress toward functional and medical goals. Tent D/C 10/22 FIM: FIM - Bathing Bathing Steps Patient Completed: Chest;Right Arm;Left Arm;Abdomen;Front perineal area;Buttocks;Right upper leg;Left upper leg Bathing: 4: Min-Patient completes 8-9 89f 10 parts or 75+ percent  FIM - Upper Body Dressing/Undressing Upper body dressing/undressing steps patient completed: Thread/unthread right bra strap;Thread/unthread left bra strap;Thread/unthread right  sleeve of pullover shirt/dresss;Thread/unthread left sleeve of pullover shirt/dress;Put head through opening of pull over shirt/dress;Pull shirt over trunk Upper body dressing/undressing: 4: Min-Patient completed 75 plus % of tasks FIM - Lower Body Dressing/Undressing Lower body dressing/undressing steps patient completed: Thread/unthread left underwear leg;Pull underwear up/down;Thread/unthread right pants leg;Thread/unthread left pants leg;Pull pants up/down;Don/Doff left shoe Lower body dressing/undressing: 3: Mod-Patient completed 50-74% of tasks  FIM - Toileting Toileting steps completed by patient: Adjust clothing prior to toileting;Performs perineal hygiene;Adjust clothing after toileting Toileting Assistive Devices: Grab bar or rail for support Toileting: 4: Steadying assist  FIM - Diplomatic Services operational officer Devices: Grab bars;Walker Toilet Transfers: 4-To toilet/BSC: Min A (steadying Pt. > 75%);4-From toilet/BSC: Min A (steadying Pt. > 75%)  FIM - Banker Devices: Walker;Arm rests Bed/Chair Transfer: 4: Chair or W/C > Bed: Min A (steadying Pt. > 75%)  FIM - Locomotion: Wheelchair Distance: 50 Locomotion: Wheelchair: 0: Activity did not occur FIM - Locomotion: Ambulation Locomotion: Ambulation Assistive Devices: Other (comment);Walker - Rolling;Orthosis Ambulation/Gait Assistance: 5: Supervision Locomotion: Ambulation: 2: Travels 50 - 149 ft with supervision/safety issues  Comprehension Comprehension Mode: Auditory Comprehension: 5-Understands basic 90% of the time/requires cueing < 10% of the time  Expression Expression Mode: Verbal Expression Assistive Devices: 6-Other (Comment) (interpreter) Expression: 5-Expresses basic 90% of the time/requires cueing < 10% of the time.  Social Interaction Social Interaction: 6-Interacts appropriately with others with medication or extra time (anti-anxiety,  antidepressant).  Problem Solving Problem Solving: 5-Solves basic 90% of the time/requires cueing < 10% of the time  Memory Memory: 5-Recognizes or recalls 90% of the time/requires cueing < 10% of the time  Medical Problem List and Plan:  1. 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  2. DVT Prophylaxis/Anticoagulation: Pharmaceutical: Lovenox  3. Pain Management: Tylenol or tramadol prn for hip/knee pain.  4. Mood: LCSW to follow for evaluation and support.  5. Neuropsych: This patient is capable of making decisions on her own behalf.  6. Skin/Wound Care: Pressure relief measures.  7. Fluids/Electrolytes/Nutrition: Monitor I/O. Encourage fluid intake.  8. Blood pressure: Monitor every 8 hours.  9. DM type 2:Controlled Hgb A1c-6.0. Will monitor with ac/hs checks. Continue trajenta with SSI for elevated BS.  10. Chronic back pain/Left hip contusion, improved with conservative care 11.  Neck pain resolved 12.  UTI sens Keflex , ecoli LOS (Days) 10 A FACE TO FACE  EVALUATION WAS PERFORMED  Demetric Dunnaway E 10/07/2014, 7:02 AM

## 2014-10-07 NOTE — Progress Notes (Signed)
Physical Therapy Session Note  Patient Details  Name: Jean Ward MRN: [id number] Date of Birth: [birth year]  Today's Date: 10/07/2014 PT Individual Time: 0830-0915 PT Individual Time Calculation (min): 45 min   Short Term Goals: Week 2:  PT Short Term Goal 1 (Week 2): =LTG's due to ELOS  Skilled Therapeutic Interventions/Progress Updates:   Pt received sitting in recliner, interpreter present. Session focused on gait training with and without AD and transfers. Added R heel wedge for mild knee hyperextension moment in stance/instability with patient reporting subjective improvement but "unlevel" feeling due to no wedge on L. Focus on safety with RW during sit <> stand as patient continues to require cues for hand placement and complete turns before sitting. Pt performed sit <> stand from standard chair. Gait training with RW multiple bouts up to 150 ft with overall supervision, verbal cues to maintain straight path due to drifting to L and forward gaze/upright posture. Without RW, ambulated multiple bouts 25-50 ft with min HHA and no LOB. Pt performed side stepping to R and L and retro gait with bilateral or R HHA for LE NMR. When asked about concerns regarding discharge this week, pt reports dressing is the most challenging task. Pt left sitting in recliner with quick release belt on and all needs within reach.   Therapy Documentation Precautions:  Precautions Precautions: Fall Precaution Comments: R inattention Restrictions Weight Bearing Restrictions: No Pain: Pain Assessment Pain Assessment: No/denies pain  See FIM for current functional status  Therapy/Group: Individual Therapy  Kerney ElbeVarner, [name] A 10/07/2014, 12:11 PM

## 2014-10-08 ENCOUNTER — Inpatient Hospital Stay (HOSPITAL_COMMUNITY): Payer: Medicare Other | Admitting: Physical Therapy

## 2014-10-08 ENCOUNTER — Inpatient Hospital Stay (HOSPITAL_COMMUNITY): Payer: Medicare Other | Admitting: *Deleted

## 2014-10-08 ENCOUNTER — Inpatient Hospital Stay (HOSPITAL_COMMUNITY): Payer: Medicare Other | Admitting: Rehabilitation

## 2014-10-08 ENCOUNTER — Encounter: Payer: Self-pay | Admitting: Internal Medicine

## 2014-10-08 ENCOUNTER — Inpatient Hospital Stay (HOSPITAL_COMMUNITY): Payer: Medicare Other

## 2014-10-08 DIAGNOSIS — T792XXA Traumatic secondary and recurrent hemorrhage and seroma, initial encounter: Secondary | ICD-10-CM

## 2014-10-08 LAB — GLUCOSE, CAPILLARY
GLUCOSE-CAPILLARY: 87 mg/dL (ref 70–99)
Glucose-Capillary: 109 mg/dL — ABNORMAL HIGH (ref 70–99)
Glucose-Capillary: 95 mg/dL (ref 70–99)
Glucose-Capillary: 111 mg/dL — ABNORMAL HIGH (ref 70–99)

## 2014-10-08 NOTE — Progress Notes (Signed)
Recreational Therapy Session Note  Patient Details  Name: Quin Hooporma Dondero MRN: [id number] Date of Birth: 14-Feb-1933 Today's Date: 10/08/2014  Pain: no c/o Skilled Therapeutic Interventions/Progress Updates: Session focused on activity tolerance, dynamic standing balance, fall recovery, safety. Interpreter present. Pt required max verbal cues, demonstration & max-total assist +2 for floor transfer.  Discussion with pt about steps of fall recovery & concluded from practice & discussion that pt would need to call for assist for any fall.  Also reiterated to pt that team was recommending 24/7 assist to due to safety concerns.  Pt stated understanding. Pt ambulated with RW with supervision.  Pt stood to toss/catch/bounce pass ball with min assist/min cues.  Pt also ambulated while tossing/catching and then bounce passing/catching a ball with min assist.  Pt stood to shoot basketball with contact guard assist.    Therapy/Group: Co-Treatment    Shmiel Morton 10/08/2014, 3:32 PM

## 2014-10-08 NOTE — Progress Notes (Signed)
Physical Therapy Session Note  Patient Details  Name: Jean Ward MRN: [id number] Date of Birth: 07-May-1933  Today's Date: 10/08/2014 PT Individual Time: 1107-1207 PT Individual Time Calculation (min): 60 min   Short Term Goals: Week 2:  PT Short Term Goal 1 (Week 2): =LTG's due to ELOS  Skilled Therapeutic Interventions/Progress Updates:   Pt received sitting in recliner in room, agreeable to therapy session with interpretor present to assist with translation during session.  Pt ambulated to sink to brush hair at S level.  Pt with intermittent min cues for hand placement for safety.  Ambulated to/from therapy gym and in ADL apt >150' with RW and R hand orthosis at S level with min cues for increased stride length and upright posture.  Educated pt on floor recovery and explained that therapist wanted to re-assess how she did with task today to determine if best plan is to attempt floor transfer or to call 911.  Pt requires max verbal cues and demonstration for getting into floor with +2 for safety (with RT).  Pt then requires max A and +2 at times to return to bed.  Pt unable to fully elevate into standing to turn and "falls" down to bed safely with assist.  Discussed calling 911 for assist and that this is main reason for having 24/7 S at time of D/C to prevent falls during gait as she is still high fall risk.  Pt verbalized understanding.  Ambulated to therapy gym to perform Wii Just Dance game to work on dynamic standing balance.  Pt able to maintain standing x 2 rep of 5 mins at S level to perform 2 songs.  Pt then performed walking ball toss and bounce to RT at min A level with cues for larger stride length.  Ended session with basketball shooting at min/guard level with focus on increasing use of RUE.  Pt ambulated back to room and left in recliner with quick release belt donned.  All needs in reach.   Therapy Documentation Precautions:  Precautions Precautions: Fall Precaution Comments:  R inattention Restrictions Weight Bearing Restrictions: No   Pain: Pt with no c/o pain during session.    Locomotion : Ambulation Ambulation/Gait Assistance: 5: Supervision   See FIM for current functional status  Therapy/Group: Individual Therapy and cotx with RT  Vista Deckarcell, Pressley Barsky Ann 10/08/2014, 12:41 PM

## 2014-10-08 NOTE — Progress Notes (Addendum)
Occupational Therapy Session Note  Patient Details  Name: Jean Ward MRN: [id number] Date of Birth: [birth year]  Today's Date: 10/08/2014 OT Individual Time: 0930-1030 OT Individual Time Calculation (min): 60 min    Short Term Goals: Week 2:  OT Short Term Goal 1 (Week 2): STG=LTGs secondary to expected discharge date  Skilled Therapeutic Interventions/Progress Updates: Therapeutic exercise with emphasis on continued right hand/arm strengthening, functional mobility using RW, and dynamic standing balance during functional task (vacuuming apartment carpeting).   Pt completed 3 exercises for hand/pinch strengthening and was provided 1 additional exercise to improve extension of right ring and small fingers (blocking MP joint to allow full extension).   Interpreter was present during entire session and wrote additional directions for pt in Spanish on instruction sheet.   Pt then performed both arm exercises but again required hand guidance to perform exercise correctly.   Pt demo's good attention and effort to task during HEP but requires supervision for control of breathing and correction of posture during exercise after she becomes fatigued.   After exercises pt was challenged to ambulate to apartment and clean floors using vacuum as she does at home.   Pt ambulated safely using RW with supervision and performed task after brief rest break.   Pt required contact guard during task but used her right hand throughout task although declaring that vacuum used was heavier than hers at her home.    Pt demo'd mild instability while vacuuming and required repeated instruction to stay closer to device d/t fall risk when extending her elbows and flexing her shoulders beyond base of support or functional reach.   After completing task, pt was escorted back to her room for assist with bathing/dressing from RN tech.     Therapy Documentation Precautions:  Precautions Precautions: Fall Precaution Comments: R  inattention Restrictions Weight Bearing Restrictions: No  Pain: No/denies pain      See FIM for current functional status  Therapy/Group: Individual Therapy  [name] 10/08/2014, 12:37 PM

## 2014-10-08 NOTE — Plan of Care (Signed)
Problem: RH Bed to Chair Transfers Goal: LTG Patient will perform bed/chair transfers w/assist (PT) LTG: Patient will perform bed/chair transfers with assistance, with/without cues (PT).  Downgraded due to need for cues for safety with RW  Problem: RH Furniture Transfers Goal: LTG Patient will perform furniture transfers w/assist (OT/PT LTG: Patient will perform furniture transfers with assistance (OT/PT).  Downgraded to S due to pts need for min cuing on safety with RW.   Problem: RH Floor Transfers Goal: LTG Patient will perform floor transfers w/assist (PT) LTG: Patient will perform floor transfers with assistance (PT).  Outcome: Not Applicable Date Met:  10/08/14 D/C floor transfer goal, as pt requires mod/max A to return to seated position, therefore recommend she/family call 911 in case of fall at home.    Problem: RH Wheelchair Mobility Goal: LTG Patient will propel w/c in controlled environment (PT) LTG: Patient will propel wheelchair in controlled environment, # of feet with assist (PT)  Outcome: Not Applicable Date Met:  10/08/14 D/C w/c goals as pt will be ambulatory at time of d/c.      

## 2014-10-08 NOTE — Progress Notes (Signed)
Social Work [name], female   DOB: Aug 06, 1933, 80 y.o.   MRN: [id number] Spoke with daughter to discuss discharge plan.  She plans to take off three weeks and have pt home with here then have her go to [place]. Will make arrangements for home health here and then see if ore needed upon transfer to [place].  Family education scheduled for Thurs at 10;00 with daughter since  She can not come before this.  Daughter to look into medicaid coverage of goes to another states for a short time.  Work toward discharge Thursday.

## 2014-10-08 NOTE — Progress Notes (Signed)
Physical Therapy Session Note  Patient Details  Name: Quin Hooporma On MRN: [id number] Date of Birth: May 18, 1933  Today's Date: 10/08/2014 PT Individual Time: 1450-1525 PT Individual Time Calculation (min): 35 min   Short Term Goals: Week 2:  PT Short Term Goal 1 (Week 2): =LTG's due to ELOS  Skilled Therapeutic Interventions/Progress Updates:   Pt received sitting in recliner, ready for therapy. Gait training room <> therapy gym using RW and supervision. Removed hand orthosis due to improved functional use and attention to RUE. Pt performed NuStep for R NMR using BUE/LE at Level 4 x 12 min. Pt negotiated up/down ramp using RW with curb step negotiation, assist for lifting/lowering RW and overall supervision. Pt ambulated using RW from therapy gym to day room including controlled and home environments with seated rest before returning to room, 2 x > 200 ft with supervision. Pt demonstrates improved ability to carry on conversation without losing balance, upright head and forward gaze, and no verbal cues required with sit <> stand for safe hand placement from recliner and NuStep. Pt left sitting in recliner with quick release belt on and all needs within reach.   Therapy Documentation Precautions:  Precautions Precautions: Fall Precaution Comments: R inattention Restrictions Weight Bearing Restrictions: No Pain: Pain Assessment Pain Assessment: No/denies pain  See FIM for current functional status  Therapy/Group: Individual Therapy  Kerney ElbeVarner, Fernandez Kenley A 10/08/2014, 3:30 PM

## 2014-10-08 NOTE — Progress Notes (Signed)
[name], female   DOB: [birth year], 80 y.o.   MRN: [id number]   Subjective/Complaints: 80 y.o. RH- female ([place] descent) with history of DM type 2, hyperlipidemia who was admitted on 09/22/14 with slurred speech, right sided weakness and right facial droop.  "little dolor" around loop recorder insertion site   Difficult to get ROS secondary to language Objective: Vital Signs: Blood pressure 123/56, pulse 56, temperature 98.3 F (36.8 C), temperature source Oral, resp. rate 18, weight 78.2 kg (172 lb 6.4 oz), SpO2 98.00%. No results found. Results for orders placed during the hospital encounter of 09/27/14 (from the past 72 hour(s))  GLUCOSE, CAPILLARY     Status: Abnormal   Collection Time    10/05/14  7:10 AM      Result Value Ref Range   Glucose-Capillary 125 (*) 70 - 99 mg/dL   Comment 1 Notify RN    GLUCOSE, CAPILLARY     Status: Abnormal   Collection Time    10/05/14 11:22 AM      Result Value Ref Range   Glucose-Capillary 150 (*) 70 - 99 mg/dL   Comment 1 Notify RN    GLUCOSE, CAPILLARY     Status: Abnormal   Collection Time    10/05/14  4:23 PM      Result Value Ref Range   Glucose-Capillary 111 (*) 70 - 99 mg/dL   Comment 1 Notify RN    GLUCOSE, CAPILLARY     Status: Abnormal   Collection Time    10/05/14  8:47 PM      Result Value Ref Range   Glucose-Capillary 129 (*) 70 - 99 mg/dL  GLUCOSE, CAPILLARY     Status: Abnormal   Collection Time    10/06/14  6:45 AM      Result Value Ref Range   Glucose-Capillary 114 (*) 70 - 99 mg/dL  GLUCOSE, CAPILLARY     Status: Abnormal   Collection Time    10/06/14 11:19 AM      Result Value Ref Range   Glucose-Capillary 119 (*) 70 - 99 mg/dL  GLUCOSE, CAPILLARY     Status: Abnormal   Collection Time    10/06/14  4:26 PM      Result Value Ref Range   Glucose-Capillary 107 (*) 70 - 99 mg/dL  GLUCOSE, CAPILLARY     Status: Abnormal   Collection Time    10/06/14  8:47 PM      Result Value Ref Range    Glucose-Capillary 114 (*) 70 - 99 mg/dL  GLUCOSE, CAPILLARY     Status: Abnormal   Collection Time    10/07/14  6:46 AM      Result Value Ref Range   Glucose-Capillary 128 (*) 70 - 99 mg/dL  GLUCOSE, CAPILLARY     Status: Abnormal   Collection Time    10/07/14 11:17 AM      Result Value Ref Range   Glucose-Capillary 107 (*) 70 - 99 mg/dL  GLUCOSE, CAPILLARY     Status: Abnormal   Collection Time    10/07/14  4:27 PM      Result Value Ref Range   Glucose-Capillary 103 (*) 70 - 99 mg/dL   Comment 1 Notify RN    GLUCOSE, CAPILLARY     Status: Abnormal   Collection Time    10/07/14  8:33 PM      Result Value Ref Range   Glucose-Capillary 132 (*) 70 - 99 mg/dL  GLUCOSE, CAPILLARY  Status: Abnormal   Collection Time    10/08/14  6:42 AM      Result Value Ref Range   Glucose-Capillary 109 (*) 70 - 99 mg/dL     HEENT: normal Cardio: RRR and no murmur Resp: CTA B/L and unlabored GI: BS positive and NT,ND Extremity:  Pulses positive and No Edema Skin:   Intact and Loop recorder site without active bleeding + ecchymosis, mild tenderness Neuro: Cranial Nerve II-XII normal, Abnormal Motor 4/5 R UE, 5/5 in LUE and BLE and Abnormal FMC Ataxic/ dec FMC Musc/Skel:  Neck tender Gen NAD Mood/affect bright , smiling  Assessment/Plan: 1. 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 which require 3+ hours per day of interdisciplinary therapy in a comprehensive inpatient rehab setting. Physiatrist is providing close team supervision and 24 hour management of active medical problems listed below. Physiatrist and rehab team continue to assess barriers to discharge/monitor patient progress toward functional and medical goals. Tent D/C 10/22 FIM: FIM - Bathing Bathing Steps Patient Completed: Chest;Right Arm;Left Arm;Abdomen;Front perineal area;Buttocks;Right upper leg;Left upper leg;Right lower leg (including foot);Left lower leg (including  foot) Bathing: 4: Steadying assist  FIM - Upper Body Dressing/Undressing Upper body dressing/undressing steps patient completed: Thread/unthread right bra strap;Thread/unthread left bra strap;Thread/unthread right sleeve of pullover shirt/dresss;Thread/unthread left sleeve of pullover shirt/dress;Put head through opening of pull over shirt/dress;Pull shirt over trunk Upper body dressing/undressing: 4: Min-Patient completed 75 plus % of tasks FIM - Lower Body Dressing/Undressing Lower body dressing/undressing steps patient completed: Thread/unthread left underwear leg;Pull underwear up/down;Thread/unthread right pants leg;Thread/unthread left pants leg;Pull pants up/down;Thread/unthread right underwear leg Lower body dressing/undressing: 3: Mod-Patient completed 50-74% of tasks  FIM - Toileting Toileting steps completed by patient: Adjust clothing prior to toileting;Performs perineal hygiene;Adjust clothing after toileting Toileting Assistive Devices: Grab bar or rail for support Toileting: 5: Supervision: Safety issues/verbal cues  FIM - Diplomatic Services operational officerToilet Transfers Toilet Transfers Assistive Devices: Grab bars;Walker Toilet Transfers: 4-To toilet/BSC: Min A (steadying Pt. > 75%);4-From toilet/BSC: Min A (steadying Pt. > 75%)  FIM - BankerBed/Chair Transfer Bed/Chair Transfer Assistive Devices: Walker;Arm rests Bed/Chair Transfer: 5: Bed > Chair or W/C: Supervision (verbal cues/safety issues);5: Chair or W/C > Bed: Supervision (verbal cues/safety issues)  FIM - Locomotion: Wheelchair Distance: 50 Locomotion: Wheelchair: 0: Activity did not occur (Simultaneous filing. User may not have seen previous data.) FIM - Locomotion: Ambulation Locomotion: Ambulation Assistive Devices: Other (comment);Walker - Rolling;Orthosis Ambulation/Gait Assistance: 5: Supervision Locomotion: Ambulation: 2: Travels 50 - 149 ft with supervision/safety issues (Simultaneous filing. User may not have seen previous  data.)  Comprehension Comprehension Mode: Auditory Comprehension: 5-Understands basic 90% of the time/requires cueing < 10% of the time  Expression Expression Mode: Verbal Expression Assistive Devices: 6-Other (Comment) (interpreter) Expression: 5-Expresses basic 90% of the time/requires cueing < 10% of the time.  Social Interaction Social Interaction: 6-Interacts appropriately with others with medication or extra time (anti-anxiety, antidepressant).  Problem Solving Problem Solving: 5-Solves basic 90% of the time/requires cueing < 10% of the time  Memory Memory: 5-Recognizes or recalls 90% of the time/requires cueing < 10% of the time  Medical Problem List and Plan:  1. 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  2. DVT Prophylaxis/Anticoagulation: Pharmaceutical: Lovenox  3. Pain Management: Tylenol or tramadol prn for hip/knee pain.  4. Mood: LCSW to follow for evaluation and support.  5. Neuropsych: This patient is capable of making decisions on her own behalf.  6. Skin/Wound Care: Pressure relief  measures.  7. Fluids/Electrolytes/Nutrition: Monitor I/O. Encourage fluid intake.  8. Blood pressure: Monitor every 8 hours.  9. DM type 2:Controlled Hgb A1c-6.0. Will monitor with ac/hs checks. Continue trajenta with SSI for elevated BS.  10. Chronic back pain/Left hip contusion, improved with conservative care 11.  Neck pain resolved 12.  UTI sens Keflex , ecoli will complete 7D tx LOS (Days) 11 A FACE TO FACE EVALUATION WAS PERFORMED  KIRSTEINS,ANDREW E 10/08/2014, 6:57 AM

## 2014-10-08 NOTE — Plan of Care (Signed)
Problem: RH Leisure Awareness Goal: LTG: Patient will participate in community (TR) LTG: Patient will participate in community reintegration activities to increase ability to identify and adapt to barriers, perform living skills, ambulate/propel wheelchair at specific level (TR)  Outcome: Not Applicable Date Met:  98/10/25 Goal discharged:  Pt refused scheduled outing

## 2014-10-08 NOTE — Progress Notes (Signed)
Physical Therapy Session Note  Patient Details  Name: Jean Ward MRN: [id number] Date of Birth: [birth year]  Today's Date: 10/08/2014 PT Individual Time: 1630-1700 PT Individual Time Calculation (min): 30 min   Short Term Goals: Week 2:  PT Short Term Goal 1 (Week 2): =LTG's due to ELOS  Skilled Therapeutic Interventions/Progress Updates:    Patient received sitting in recliner. Session focused on gait training and R LE NMR (see details below). Gait training 34>150' x2 with R HHA and minA with emphasis on increasing gait speed and looking ahead. Patient returned to room and left seated in recliner with seatbelt donned and all needs within reach.  Therapy Documentation Precautions:  Precautions Precautions: Fall Precaution Comments: R inattention Restrictions Weight Bearing Restrictions: No Pain: Pain Assessment Pain Assessment: No/denies pain Pain Score: 0-No pain Locomotion : Ambulation Ambulation/Gait Assistance: 4: Min assist  Other Treatments: Treatments Neuromuscular Facilitation: Right;Lower Extremity;Forced use;Activity to increase motor control;Activity to increase timing and sequencing;Activity to increase grading;Activity to increase sustained activation;Activity to increase lateral weight shifting (Tandem sit<>stand and sit<>stand with R LE elevated on step , both to facilitate increased weight bearing through R LE)  See FIM for current functional status  Therapy/Group: Individual Therapy  Chipper HerbBridget S Sanvi Ehler S. Ozzie Remmers, PT, DPT 10/08/2014, 5:17 PM

## 2014-10-09 ENCOUNTER — Inpatient Hospital Stay (HOSPITAL_COMMUNITY): Payer: Medicare Other | Admitting: Physical Therapy

## 2014-10-09 ENCOUNTER — Inpatient Hospital Stay (HOSPITAL_COMMUNITY): Payer: Medicare Other | Admitting: Occupational Therapy

## 2014-10-09 ENCOUNTER — Inpatient Hospital Stay (HOSPITAL_COMMUNITY): Payer: Medicare Other | Admitting: Rehabilitation

## 2014-10-09 LAB — GLUCOSE, CAPILLARY
Glucose-Capillary: 93 mg/dL (ref 70–99)
Glucose-Capillary: 96 mg/dL (ref 70–99)
Glucose-Capillary: 113 mg/dL — ABNORMAL HIGH (ref 70–99)
Glucose-Capillary: 106 mg/dL — ABNORMAL HIGH (ref 70–99)

## 2014-10-09 NOTE — Progress Notes (Signed)
Physical Therapy Discharge Summary  Patient Details  Name: Jean Ward MRN: [id number] Date of Birth: 01-Jan-1933  Today's Date: 10/10/2014 session was 10/21 PT Individual Time:  - [id number]      Patient has met 10 of 10 long term goals due to improved activity tolerance, improved balance, improved postural control, increased strength, ability to compensate for deficits, functional use of  right upper extremity and right lower extremity, improved attention, improved awareness and improved coordination.  Patient to discharge at an ambulatory level Supervision.   Patient's care partner is independent to provide the necessary cognitive assistance at discharge.  Reasons goals not met: n/a  Recommendation:  Patient will benefit from ongoing skilled PT services in home health setting to continue to advance safe functional mobility, address ongoing impairments in decreased balance, decreased awareness, decreased fine motor skills in RUE, decreased attention to the R, and minimize fall risk.  Equipment: RW  Reasons for discharge: treatment goals met and discharge from hospital  Patient/family agrees with progress made and goals achieved: Yes  PT Treatment/Intervention:  Pt received sitting in recliner in room, agreeable to therapy session.  Discussed goals of session were to check off all goals since last day of therapy.  Performed gait in controlled, home and simulated community environment >300' with RW at S level.  Min cues for upright posture and remaining close to RW throughout.  Performed car transfer at S level x 2 reps with mod cues for safety on first rep and pt recalling safety during second rep.  Performed bed mobility in ADL apt at mod I level without shoes to decrease friction on bed spread.  Performed furniture transfer at S level.  Continues to require min cues intermittently for hand placement, but no overt LOB.  Negotiated up/down 4, 6" steps x 2 reps with B handrails to simulate  [place] home entry.  Did not reassess stairs with [place] entry due to being here for 3 weeks and will likely progress.  Cues for safety and stepping sequence.  Pt returned demonstration safely at S level.  Ended session with assessment of strength, sensation, coordination, etc, see below for details.  Also navigated through nursing station to simulate busy community environment at S level.  Pt ambulated back to room and left in recliner without quick release belt.  Discussed that she will not have this at home and needs to work on remembering to call for assist when getting up.  Pt verbalized understanding.    PT Discharge Precautions/Restrictions Precautions Precautions: Fall Precaution Comments: R inattention Restrictions Weight Bearing Restrictions: No Vital Signs Therapy Vitals Temp: 98.4 F (36.9 C) Temp Source: Oral Pulse Rate: 51 Resp: 17 BP: 128/42 mmHg Patient Position (if appropriate): Lying Oxygen Therapy SpO2: 99 % O2 Device: None (Room air) Pain: No pain during session.     Vision/Perception: WFL, no changes from baseline    Cognition Overall Cognitive Status: Within Functional Limits for tasks assessed Arousal/Alertness: Awake/alert Orientation Level: Oriented X4 Memory: Impaired Memory Impairment: Decreased recall of new information Awareness: Appears intact Safety/Judgment: Appears intact Comments: Pt able to state that she wants safety belt on due to her memory and she may forget to call and try to get up to use restroom.  Sensation Sensation Light Touch: Appears Intact Stereognosis: Not tested Hot/Cold: Not tested Proprioception: Appears Intact Coordination Gross Motor Movements are Fluid and Coordinated: Yes Fine Motor Movements are Fluid and Coordinated: No Coordination and Movement Description: decreased fine motor in R hand and decreased  coordination in [place] Test: delayed Motor  Motor Motor: Hemiplegia Motor - Discharge  Observations: decreased balance, decreased strength in RUE/RLE  Mobility Bed Mobility Bed Mobility: Supine to Sit;Sit to Supine Supine to Sit: 6: Modified independent (Device/Increase time) Sit to Supine: 6: Modified independent (Device/Increase time) Transfers Transfers: Yes Sit to Stand: 5: Supervision Sit to Stand Details: Verbal cues for sequencing;Verbal cues for technique Stand to Sit: 5: Supervision Stand to Sit Details (indicate cue type and reason): Verbal cues for sequencing;Verbal cues for technique Stand Pivot Transfers: 5: Supervision Stand Pivot Transfer Details: Verbal cues for sequencing;Verbal cues for technique;Verbal cues for precautions/safety Locomotion  Ambulation Ambulation: Yes Ambulation/Gait Assistance: 5: Supervision Ambulation Distance (Feet): 300 Feet Assistive device: Rolling walker Ambulation/Gait Assistance Details: Verbal cues for sequencing;Verbal cues for technique;Verbal cues for precautions/safety Gait Gait: Yes Gait Pattern: Impaired Gait Pattern: Trunk flexed;Narrow base of support;Decreased weight shift to right Stairs / Additional Locomotion Stairs: Yes Stairs Assistance: 5: Supervision Stairs Assistance Details: Verbal cues for sequencing;Verbal cues for technique;Verbal cues for precautions/safety Stair Management Technique: Two rails;Step to pattern;Forwards Number of Stairs: 8 Height of Stairs: 6 Wheelchair Mobility Wheelchair Mobility: No (pt ambulatory) Distance:  (pt ambulatory did not assess)  Trunk/Postural Assessment  Cervical Assessment Cervical Assessment: Within Functional Limits Thoracic Assessment Thoracic Assessment: Within Functional Limits Lumbar Assessment Lumbar Assessment: Within Functional Limits Postural Control Postural Control: Within Functional Limits Righting Reactions: Continues to have delayed ankle strategy and poor hip and stepping strategy  Balance Balance Balance Assessed: Yes Static Sitting  Balance Static Sitting - Balance Support: Feet supported Dynamic Sitting Balance Dynamic Sitting - Level of Assistance: 6: Modified independent (Device/Increase time) Static Standing Balance Static Standing - Balance Support: During functional activity Static Standing - Level of Assistance: 6: Modified independent (Device/Increase time) Dynamic Standing Balance Dynamic Standing - Balance Support: During functional activity Dynamic Standing - Level of Assistance: 5: Stand by assistance Extremity Assessment      RLE Assessment RLE Assessment: Exceptions to [facility] RLE Strength RLE Overall Strength: Deficits RLE Overall Strength Comments: hip flex grossly 3+/5, knee flex and ext 4/5, ankle DF/PF 3+/5 LLE Assessment LLE Assessment: Within Functional Limits  See FIM for current functional status  Denice Bors 10/10/2014, 12:12 PM

## 2014-10-09 NOTE — Patient Care Conference (Signed)
Inpatient RehabilitationTeam Conference and Plan of Care Update Date: 10/09/2014   Time: 11;55 Am    Patient Name: Jean Ward      Medical Record Number: [id number]  Date of Birth: [birth year] Sex: Female         Room/Bed: 4M03C/4M03C-01 Payor Info: Payor: MEDICARE / Plan: MEDICARE PART A AND B / Product Type: *No Product type* /    Admitting Diagnosis: stroke cva  Admit Date/Time:  09/27/2014  7:32 PM Admission Comments: No comment available   Primary Diagnosis:  CVA (cerebral infarction) Principal Problem: CVA (cerebral infarction)  Patient Active Problem List   Diagnosis Date Noted  . Dyslipidemia 10/03/2014  . Knee pain, chronic 10/03/2014  . UTI (urinary tract infection) 10/03/2014  . Hemiparesis affecting right side as late effect of stroke 10/02/2014  . CVA (cerebral infarction) 09/22/2014  . Diabetes mellitus type 2, controlled, without complications 09/22/2014    Expected Discharge Date: Expected Discharge Date: 10/10/14  Team Members Present: Physician leading conference: Dr. Claudette LawsAndrew Kirsteins Social Worker Present: Dossie DerBecky Abou Sterkel, LCSW Nurse Present: Carmie EndAngie Joyce, RN PT Present: Edman CircleAudra Hall, PT;Oniel Meleski Varner, PT;Emily Marya AmslerParcell, PT OT Present: Callie FieldingKatie Pittman, OT;Jennifer Katrinka BlazingSmith, OT SLP Present: Fae PippinMelissa Bowie, SLP PPS Coordinator present : Tora DuckMarie Noel, RN, CRRN     Current Status/Progress Goal Weekly Team Focus  Medical   Right hemiparesis improving, no further dizziness, UTI has been treated  Discharged to family at [id number] [id number] supervision level  Discharge planning   Bowel/Bladder   Continent of bowel and bladder; LBM 10/20; just finished antibiotics for UTI  mod I  Assess for constipation and treat prn   Swallow/Nutrition/ Hydration     WFL-regular diet        ADL's   supervision overall  Overall supervision to min assist (some LTGs downgraded from Eval)  patient education, functional transfers, home management, dynamic standing balance, STS   Mobility   S overall   S overall  pt/family education, dc planning   Communication     [facility]        Safety/Cognition/ Behavioral Observations    no unsafe behaviors        Pain   Voltaren gel to left buttocks, muscle rub to neck prn  < 3  Assess and treat for pain q shift and prn   Skin   Loop recorder to left chest- steri strips, scattered bruising, no skin breakdown noted  Patients skin will remain free from infection or breakdown with min assist.  Assess skin q shift and prn      *See Care Plan and progress notes for long and short-term goals.  Barriers to Discharge: None    Possible Resolutions to Barriers:  Discharged 10/10/2014    Discharge Planning/Teaching Needs:  Home [place] three [place]  Local daughter to be here tomorrow at 10;00 for family education.      Team Discussion:  Meeting goals  For discharge tomorrow.  Daughter to come in at 10;00 for family education.  Medically stable for discharge tomorrow  Revisions to Treatment Plan:  None   Continued Need for Acute Rehabilitation Level of Care: The patient requires daily medical management by a physician with specialized training in physical medicine and rehabilitation for the following conditions: Daily direction of a multidisciplinary physical rehabilitation program to ensure safe treatment while eliciting the highest outcome that is of practical value to the patient.: Yes Daily medical management of patient stability for increased activity during  participation in an intensive rehabilitation regime.: Yes Daily analysis of laboratory values and/or radiology reports with any subsequent need for medication adjustment of medical intervention for : Neurological problems  Ricki Vanhandel, Lemar LivingsRebecca G 10/10/2014, 1:26 PM

## 2014-10-09 NOTE — Progress Notes (Signed)
Occupational Therapy Discharge Summary and treatment intervention  Patient Details  Name: Jean Ward MRN: [id number] Date of Birth: 09/02/1933  Today's Date: 10/09/2014 OT Individual Time: 7253-6644 and 0347-4259 OT Individual Time Calculation (min): 60 min and 30 min   Patient has met 10 of 10 long term goals due to improved activity tolerance, improved balance, postural control, functional use of  RIGHT upper extremity, improved attention, improved awareness and improved coordination.  Patient to discharge at overall Supervision level.  Family education scheduled for 10/10/14. However, family did arrive for scheduled education.   Recommendation:  Patient will benefit from ongoing skilled OT services in home health setting to continue to advance functional skills in the area of BADL.  Equipment: tub transfer bench  Reasons for discharge: treatment goals met  Patient/family agrees with progress made and goals achieved: Yes  OT intervention: Session 1: Upon entering the room, pt seated in recliner chair with no c/o pain this session. Pt reporting, "I feel much stronger and balance is better." Interpreter present during session to assist with education. OT session with focus on self care, dynamic standing balance, safety awareness, functional mobility, functional transfers, and patient education. Pt required supervision for ambulation with RW to obtain all clothing and needed items from dresser. Pt performed toilet and tub transfer with supervision with use of RW for safety. Bathing performed with supervision and pt crossed ankle to knee in order to reach feet to wash and to thread clothing safely. Pt performed dressing seated on EOB with supervision. Grooming performed in standing at sink side with supervision for safety. Pt did not require verbal cues for safety with use of RW. Pt seated in recliner chair awaiting next therapist with interpreter in room and all needed items within reach.  Pt demonstrated grip strengthening HEP with supervision and min verbal cues with use of red, medium soft theraputty in R hand 1-9 exercises.   Session 2: Pt ambulated 150+ feet to ADL apartment with use of RW and supervision only. Pt standing at sink side and gathered all dirty dishes to wash. Pt reaching into cabinets to obtain dish soap. Pt stood for ~ 15 minutes at sink inside of RW with supervision to wash dishes. Pt ambulating back to room with supervision and use of RW. Pt with no further questions of concerns.Interpreter not present this session so therapist and patient communicating via translater app.  OT Discharge Precautions/Restrictions  Precautions Precautions: Fall Precaution Comments: R inattention Restrictions Weight Bearing Restrictions: No Pain Pain Assessment Pain Assessment: No/denies pain Pain Score: 0-No pain Vision/Perception  Vision- History Baseline Vision/History: No visual deficits  Cognition Overall Cognitive Status: Within Functional Limits for tasks assessed Arousal/Alertness: Awake/alert Orientation Level: Oriented X4 Memory: Impaired Memory Impairment: Decreased recall of new information Awareness: Appears intact Safety/Judgment: Appears intact Comments: Pt able to state that she wants safety belt on due to her memory and she may forget to call and try to get up to use restroom.  Sensation Sensation Light Touch: Appears Intact Hot/Cold: Appears Intact Proprioception: Appears Intact Coordination Gross Motor Movements are Fluid and Coordinated: Yes Fine Motor Movements are Fluid and Coordinated: No (decreased for dexterity and speed but pt has made significant improvements ) Coordination and Movement Description: decreased fine motor in R hand and decreased coordination in RUE/LE Motor  Motor Motor: Hemiplegia Motor - Discharge Observations: decreased balance, decreased strength and coordination in RUE/RLE Mobility  Bed Mobility Bed Mobility:  Supine to Sit;Sit to Supine Supine to Sit: 6: Modified independent (  Device/Increase time) Transfers Sit to Stand: 5: Supervision Sit to Stand Details: Verbal cues for sequencing;Verbal cues for technique Stand to Sit: 5: Supervision Stand to Sit Details (indicate cue type and reason): Verbal cues for sequencing;Verbal cues for technique  Trunk/Postural Assessment  Cervical Assessment Cervical Assessment: Within Functional Limits Thoracic Assessment Thoracic Assessment: Within Functional Limits Lumbar Assessment Lumbar Assessment: Within Functional Limits Postural Control Postural Control: Within Functional Limits  Balance Balance Balance Assessed: Yes Standardized Balance Assessment Standardized Balance Assessment: Berg Balance Test Berg Balance Test Sit to Stand: Able to stand  independently using hands Standing Unsupported: Able to stand safely 2 minutes Sitting with Back Unsupported but Feet Supported on Floor or Stool: Able to sit safely and securely 2 minutes Stand to Sit: Sits safely with minimal use of hands Transfers: Able to transfer safely, minor use of hands Standing Unsupported with Eyes Closed: Able to stand 10 seconds with supervision Standing Ubsupported with Feet Together: Able to place feet together independently and stand for 1 minute with supervision From Standing, Reach Forward with Outstretched Arm: Can reach forward >12 cm safely (5") From Standing Position, Pick up Object from Floor: Able to pick up shoe, needs supervision From Standing Position, Turn to Look Behind Over each Shoulder: Looks behind one side only/other side shows less weight shift Turn 360 Degrees: Needs close supervision or verbal cueing Standing Unsupported, Alternately Place Feet on Step/Stool: Able to complete >2 steps/needs minimal assist Standing Unsupported, One Foot in Front: Able to plae foot ahead of the other independently and hold 30 seconds Standing on One Leg: Tries to lift  leg/unable to hold 3 seconds but remains standing independently Total Score: 40 Dynamic Standing Balance Dynamic Standing - Balance Support: During functional activity Dynamic Standing - Level of Assistance: 5: Stand by assistance Dynamic Standing - Comments: ambulation with walker, washing dishes, LB dressing and bathing Extremity/Trunk Assessment RUE Assessment RUE Assessment: Exceptions to [facility] RUE AROM (degrees) Overall AROM Right Upper Extremity: Deficits (AROM is WFLS, shoulder flexion continues to be decreased but significantly improved ) RUE Strength RUE Overall Strength Comments: 3+/5 throughout LUE Assessment LUE Assessment: Within Functional Limits  See FIM for current functional status  Phineas Semen 10/09/2014, 11:36 AM

## 2014-10-09 NOTE — Progress Notes (Signed)
Physical Therapy Session Note  Patient Details  Name: Jean Ward MRN: [id number] Date of Birth: 1933-11-20  Today's Date: 10/09/2014 PT Individual Time: 1450-1535 PT Individual Time Calculation (min): 45 min   Short Term Goals: Week 2:  PT Short Term Goal 1 (Week 2): =LTG's due to ELOS  Skilled Therapeutic Interventions/Progress Updates:   Pt received sitting in recliner, agreeable to therapy. Pt performs all transfers at supervision level without need for cues for hand placement. Gait training using RW 2 x 150 ft with supervision and short distances without AD and supervision. Berg Balance Scale administered with score of 40/56, pt educated regarding falls risk. Pt negotiated up/down 5 stairs using 2 rails and up/down 5 stairs using 1 rail, cues for sequencing step-to pattern and overall supervision. Pt performed NMR on NuStep using BUE/LE at level 7 x 10 min. Pt returned to room and left sitting in recliner with all needs within reach, no further questions regarding discharge.   Therapy Documentation Precautions:  Precautions Precautions: Fall Precaution Comments: R inattention Restrictions Weight Bearing Restrictions: No Vital Signs: Therapy Vitals Temp: 98.3 F (36.8 C) Temp Source: Oral Pulse Rate: 72 Resp: 16 BP: 135/55 mmHg Patient Position (if appropriate): Sitting Oxygen Therapy SpO2: 97 % O2 Device: None (Room air) Pain: Pain Assessment Pain Assessment: No/denies pain Pain Score: 0-No pain  See FIM for current functional status  Therapy/Group: Individual Therapy  Kerney ElbeVarner, Carrine Kroboth A 10/09/2014, 4:21 PM

## 2014-10-09 NOTE — Progress Notes (Signed)
Social Work [name], female   DOB: Apr 12, 1933, 80 y.o.   MRN: [id number] Met with pt and spoke with daughter to discuss team conference readiness for discharge tomorrow and daughter to be here for family education. Agreeable to DME and follow up therapies.  See daughter tomorrow regarding questions or concerns tomorrow.

## 2014-10-09 NOTE — Progress Notes (Signed)
[name], female   DOB: 1933/08/09, 80 y.o.   MRN: [id number]   Subjective/Complaints: 80 y.o. RH- female ([place] descent) with history of DM type 2, hyperlipidemia who was admitted on 09/22/14 with slurred speech, right sided weakness and right facial droop.    Pt asked me to check loop recorder site Difficult to get ROS secondary to language Objective: Vital Signs: Blood pressure 128/42, pulse 51, temperature 98.4 F (36.9 C), temperature source Oral, resp. rate 17, weight 78.2 kg (172 lb 6.4 oz), SpO2 99.00%. No results found. Results for orders placed during the hospital encounter of 09/27/14 (from the past 72 hour(s))  GLUCOSE, CAPILLARY     Status: Abnormal   Collection Time    10/06/14  6:45 AM      Result Value Ref Range   Glucose-Capillary 114 (*) 70 - 99 mg/dL  GLUCOSE, CAPILLARY     Status: Abnormal   Collection Time    10/06/14 11:19 AM      Result Value Ref Range   Glucose-Capillary 119 (*) 70 - 99 mg/dL  GLUCOSE, CAPILLARY     Status: Abnormal   Collection Time    10/06/14  4:26 PM      Result Value Ref Range   Glucose-Capillary 107 (*) 70 - 99 mg/dL  GLUCOSE, CAPILLARY     Status: Abnormal   Collection Time    10/06/14  8:47 PM      Result Value Ref Range   Glucose-Capillary 114 (*) 70 - 99 mg/dL  GLUCOSE, CAPILLARY     Status: Abnormal   Collection Time    10/07/14  6:46 AM      Result Value Ref Range   Glucose-Capillary 128 (*) 70 - 99 mg/dL  GLUCOSE, CAPILLARY     Status: Abnormal   Collection Time    10/07/14 11:17 AM      Result Value Ref Range   Glucose-Capillary 107 (*) 70 - 99 mg/dL  GLUCOSE, CAPILLARY     Status: Abnormal   Collection Time    10/07/14  4:27 PM      Result Value Ref Range   Glucose-Capillary 103 (*) 70 - 99 mg/dL   Comment 1 Notify RN    GLUCOSE, CAPILLARY     Status: Abnormal   Collection Time    10/07/14  8:33 PM      Result Value Ref Range   Glucose-Capillary 132 (*) 70 - 99 mg/dL  GLUCOSE,  CAPILLARY     Status: Abnormal   Collection Time    10/08/14  6:42 AM      Result Value Ref Range   Glucose-Capillary 109 (*) 70 - 99 mg/dL  GLUCOSE, CAPILLARY     Status: None   Collection Time    10/08/14 10:59 AM      Result Value Ref Range   Glucose-Capillary 87  70 - 99 mg/dL  GLUCOSE, CAPILLARY     Status: None   Collection Time    10/08/14  4:14 PM      Result Value Ref Range   Glucose-Capillary 95  70 - 99 mg/dL   Comment 1 Notify RN    GLUCOSE, CAPILLARY     Status: Abnormal   Collection Time    10/08/14  9:17 PM      Result Value Ref Range   Glucose-Capillary 111 (*) 70 - 99 mg/dL     HEENT: normal Cardio: RRR and no murmur Resp: CTA B/L and unlabored GI: BS positive and  NT,ND Extremity:  Pulses positive and No Edema Skin:   Intact and Loop recorder site without active bleeding + ecchymosis, no tenderness Neuro: Cranial Nerve II-XII normal, Abnormal Motor 4/5 R UE, 5/5 in LUE and BLE and Abnormal FMC Ataxic/ dec FMC Musc/Skel:  Neck tender Gen NAD Mood/affect bright , smiling  Assessment/Plan: 1. 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 which require 3+ hours per day of interdisciplinary therapy in a comprehensive inpatient rehab setting. Physiatrist is providing close team supervision and 24 hour management of active medical problems listed below. Physiatrist and rehab team continue to assess barriers to discharge/monitor patient progress toward functional and medical goals. Team conference today please see physician documentation under team conference tab, met with team face-to-face to discuss problems,progress, and goals. Formulized individual treatment plan based on medical history, underlying problem and comorbidities. FIM: FIM - Bathing Bathing Steps Patient Completed: Chest;Right Arm;Left Arm;Abdomen;Front perineal area;Buttocks;Right upper leg;Left upper leg;Right lower leg (including foot);Left lower leg  (including foot) Bathing: 4: Steadying assist  FIM - Upper Body Dressing/Undressing Upper body dressing/undressing steps patient completed: Thread/unthread right bra strap;Thread/unthread left bra strap;Thread/unthread right sleeve of pullover shirt/dresss;Thread/unthread left sleeve of pullover shirt/dress;Put head through opening of pull over shirt/dress;Pull shirt over trunk Upper body dressing/undressing: 4: Min-Patient completed 75 plus % of tasks FIM - Lower Body Dressing/Undressing Lower body dressing/undressing steps patient completed: Thread/unthread left underwear leg;Pull underwear up/down;Thread/unthread right pants leg;Thread/unthread left pants leg;Pull pants up/down;Thread/unthread right underwear leg Lower body dressing/undressing: 3: Mod-Patient completed 50-74% of tasks  FIM - Toileting Toileting steps completed by patient: Adjust clothing prior to toileting;Performs perineal hygiene;Adjust clothing after toileting Toileting Assistive Devices: Grab bar or rail for support Toileting: 4: Steadying assist  FIM - Radio producer Devices: Grab bars;Walker Toilet Transfers: 5-To toilet/BSC: Supervision (verbal cues/safety issues);5-From toilet/BSC: Supervision (verbal cues/safety issues)  FIM - Bed/Chair Transfer Bed/Chair Transfer Assistive Devices: Copy: 5: Chair or W/C > Bed: Supervision (verbal cues/safety issues);5: Bed > Chair or W/C: Supervision (verbal cues/safety issues)  FIM - Locomotion: Wheelchair Distance: 50 Locomotion: Wheelchair: 0: Activity did not occur FIM - Locomotion: Ambulation Locomotion: Ambulation Assistive Devices: Other (comment) (R HHA) Ambulation/Gait Assistance: 4: Min assist Locomotion: Ambulation: 4: Travels 150 ft or more with minimal assistance (Pt.>75%)  Comprehension Comprehension Mode: Auditory Comprehension: 5-Understands complex 90% of the time/Cues < 10% of the  time  Expression Expression Mode: Verbal Expression Assistive Devices: 6-Other (Comment) (interpreter) Expression: 5-Expresses basic needs/ideas: With no assist  Social Interaction Social Interaction: 6-Interacts appropriately with others with medication or extra time (anti-anxiety, antidepressant).  Problem Solving Problem Solving: 5-Solves basic 90% of the time/requires cueing < 10% of the time  Memory Memory: 5-Recognizes or recalls 90% of the time/requires cueing < 10% of the time  Medical Problem List and Plan:  1. 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  2. DVT Prophylaxis/Anticoagulation: Pharmaceutical: Lovenox  3. Pain Management: Tylenol or tramadol prn for hip/knee pain.  4. Mood: LCSW to follow for evaluation and support.  5. Neuropsych: This patient is capable of making decisions on her own behalf.  6. Skin/Wound Care: Pressure relief measures.  7. Fluids/Electrolytes/Nutrition: Monitor I/O. Encourage fluid intake.  8. Blood pressure: Monitor every 8 hours.  9. DM type 2:Controlled Hgb A1c-6.0. Will monitor with ac/hs checks. Continue trajenta with SSI for elevated BS.  10. Chronic back pain/Left hip contusion, improved with conservative care 11.  Neck pain resolved  12.  UTI sens Keflex , completed tx LOS (Days) 12 A FACE TO FACE EVALUATION WAS PERFORMED  Narda Fundora E 10/09/2014, 6:35 AM

## 2014-10-10 ENCOUNTER — Encounter (HOSPITAL_COMMUNITY): Payer: Medicare Other | Admitting: Occupational Therapy

## 2014-10-10 ENCOUNTER — Ambulatory Visit (HOSPITAL_COMMUNITY): Payer: Medicare Other | Admitting: Rehabilitation

## 2014-10-10 DIAGNOSIS — M25562 Pain in left knee: Secondary | ICD-10-CM

## 2014-10-10 DIAGNOSIS — G8929 Other chronic pain: Secondary | ICD-10-CM

## 2014-10-10 LAB — GLUCOSE, CAPILLARY
GLUCOSE-CAPILLARY: 106 mg/dL — AB (ref 70–99)
Glucose-Capillary: 84 mg/dL (ref 70–99)

## 2014-10-10 MED ORDER — DICLOFENAC SODIUM 1 % TD GEL: 2.0000 g | Freq: Three times a day (TID) | TRANSDERMAL | Status: AC

## 2014-10-10 MED ORDER — PANTOPRAZOLE SODIUM 40 MG PO TBEC: 40.0000 mg | DELAYED_RELEASE_TABLET | Freq: Every day | ORAL | Status: AC

## 2014-10-10 MED ORDER — SENNOSIDES-DOCUSATE SODIUM 8.6-50 MG PO TABS: 2.0000 | ORAL_TABLET | Freq: Every day | ORAL | Status: AC

## 2014-10-10 MED ORDER — SITAGLIPTIN PHOSPHATE 100 MG PO TABS: 100.0000 mg | ORAL_TABLET | Freq: Every day | ORAL | Status: AC

## 2014-10-10 MED ORDER — CLOPIDOGREL BISULFATE 75 MG PO TABS: 75.0000 mg | ORAL_TABLET | Freq: Every day | ORAL | Status: AC

## 2014-10-10 MED ORDER — SIMVASTATIN 40 MG PO TABS: 40.0000 mg | ORAL_TABLET | Freq: Every day | ORAL | Status: AC

## 2014-10-10 NOTE — Progress Notes (Signed)
Occupational Therapy Note  Patient Details  Name: Jean Ward MRN: [id number] Date of Birth: [birth year]   Family did not arrive for scheduled family education today. Please see discharge note for details.    Lowella Gripittman, Mikell Camp L 10/10/2014, 10:02 AM

## 2014-10-10 NOTE — Progress Notes (Signed)
Social Work Jean Chris, LCSW Social Worker Signed  Patient Care Conference Service date: 10/09/2014 1:40 PM  Inpatient RehabilitationTeam Conference and Plan of Care Update Date: 10/09/2014   Time: 11;55 Am     Patient Name: Jean Ward       Medical Record Number: [id number]   Date of Birth: 02-11-1933 Sex: Female         Room/Bed: 4M03C/4M03C-01 Payor Info: Payor: MEDICARE / Plan: MEDICARE PART A AND B / Product Type: *No Product type* /   Admitting Diagnosis: stroke cva   Admit Date/Time:  09/27/2014  7:32 PM Admission Comments: No comment available   Primary Diagnosis:  CVA (cerebral infarction) Principal Problem: CVA (cerebral infarction)    Patient Active Problem List     Diagnosis  Date Noted   .  Dyslipidemia  10/03/2014   .  Knee pain, chronic  10/03/2014   .  UTI (urinary tract infection)  10/03/2014   .  Hemiparesis affecting right side as late effect of stroke  10/02/2014   .  CVA (cerebral infarction)  09/22/2014   .  Diabetes mellitus type 2, controlled, without complications  09/22/2014     Expected Discharge Date: Expected Discharge Date: 10/10/14  Team Members Present: Physician leading conference: Dr. Claudette Ward Social Worker Present: Jean Der, LCSW Nurse Present: Jean End, RN PT Present: Jean Ward, PT;Jean Ward, PT;Jean Ward, PT OT Present: Jean Ward, OT;Jean Ward, OT SLP Present: Jean Ward, SLP PPS Coordinator present : Jean Duck, RN, CRRN        Current Status/Progress  Goal  Weekly Team Focus   Medical     Right hemiparesis improving, no further dizziness, UTI has been treated  Discharged to family at 67 7 supervision level  Discharge planning   Bowel/Bladder     Continent of bowel and bladder; LBM 10/20; just finished antibiotics for UTI  mod I  Assess for constipation and treat prn   Swallow/Nutrition/ Hydration     WFL-regular diet       ADL's     supervision overall  Overall supervision to min  assist (some LTGs downgraded from Eval)  patient education, functional transfers, home management, dynamic standing balance, STS   Mobility     S overall  S overall  pt/family education, dc planning   Communication     [facility]       Safety/Cognition/ Behavioral Observations    no unsafe behaviors       Pain     Voltaren gel to left buttocks, muscle rub to neck prn  < 3  Assess and treat for pain q shift and prn   Skin     Loop recorder to left chest- steri strips, scattered bruising, no skin breakdown noted  Patients skin will remain free from infection or breakdown with min assist.  Assess skin q shift and prn     *See Care Plan and progress notes for long and short-term goals.    Barriers to Discharge:  None     Possible Resolutions to Barriers:    Discharged 10/10/2014      Discharge Planning/Teaching Needs:    Home with daughter for three weeks then going to [place] with another daughter.  Local daughter to be here tomorrow at 10;00 for family education.      Team Discussion:    Meeting goals  For discharge tomorrow.  Daughter to come in at 10;00 for family education.  Medically stable for discharge tomorrow  Revisions to Treatment Plan:    None    Continued Need for Acute Rehabilitation Level of Care: The patient requires daily medical management by a physician with specialized training in physical medicine and rehabilitation for the following conditions: Daily direction of a multidisciplinary physical rehabilitation program to ensure safe treatment while eliciting the highest outcome that is of practical value to the patient.: Yes Daily medical management of patient stability for increased activity during participation in an intensive rehabilitation regime.: Yes Daily analysis of laboratory values and/or radiology reports with any subsequent need for medication adjustment of medical intervention for : Neurological problems  Jean Ward, Jean Ward 10/10/2014, 1:26 PM          Jean Chrisebecca Ward Jean Livesay, LCSW Social Worker Signed  Patient Care Conference Service date: 10/02/2014 12:59 PM  Inpatient RehabilitationTeam Conference and Plan of Care Update Date: 10/02/2014   Time: 11:30 AM     Patient Name: Jean Ward       Medical Record Number: [id number]   Date of Birth: Aug 26, 1933 Sex: Female         Room/Bed: 4M03C/4M03C-01 Payor Info: Payor: MEDICARE / Plan: MEDICARE PART A AND B / Product Type: *No Product type* /   Admitting Diagnosis: stroke cva   Admit Date/Time:  09/27/2014  7:32 PM Admission Comments: No comment available   Primary Diagnosis:  <principal problem not specified> Principal Problem: <principal problem not specified>    Patient Active Problem List     Diagnosis  Date Noted   .  CVA (cerebral infarction)  09/22/2014   .  Diabetes mellitus type 2, controlled, without complications  09/22/2014     Expected Discharge Date: Expected Discharge Date: 10/10/14  Team Members Present: Physician leading conference: Dr. Claudette LawsAndrew Ward Social Worker Present: Jean DerBecky Trejon Duford, LCSW Nurse Present: Jean EndAngie Joyce, RN PT Present: Jean CircleAudra Ward, PT;Jean Ward, PT;Jean Ward, PT OT Present: Jean FieldingKatie Ward, Jean Ward;Jean Ward, OT SLP Present: Jean LombardNicole Ward, SLP PPS Coordinator present : Jean SnowballBecky Ward, PT        Current Status/Progress  Goal  Weekly Team Focus   Medical     UTI, ?orthostasis vs anxiety causing dizziness  maintain medical stability during rehab  treat UTI empirically await cultures   Bowel/Bladder     Continent of bowel and bladder; LBM 10/13  Remain continent of bowel and bladder with min assist.  Assess for constipation and treat prn   Swallow/Nutrition/ Hydration     WFL       ADL's     Overall min-max  Overall supervision to min assist (some LTGs downgraded from Eval)  sit><stands, functional mobility during BADL, RUE functional use, safe bathroom transfers, right visual/spatial inattention, static and dynamic balance, patient and  family education   Mobility     S for bed mobility, min A for stand pivot transfers, min A gait without device, min/guard with use of RW.  Limited by decreased attention to R, decreased balance strategies/awareness  mod I from w/c level and S for gait   balance, gait, NMR for RLE/UE, pt/family education, transfers.    [facility]ication     [facility]       Safety/Cognition/ Behavioral Observations    no unsafe behaviors       Pain     No c/o pain  < 3  Assess and treat for pain q shift   Skin     Loop recorder to left chest  Patients skin will remain free from infection or breakdown with min assist.  Assess  skin q shift and prn     *See Care Plan and progress notes for long and short-term goals.    Barriers to Discharge:  still requires physical assist, dizziness of unclear etiology      Possible Resolutions to Barriers:    see above      Discharge Planning/Teaching Needs:    [place] 24 hr supervision level.  Try to make arrnagements for new PCP and follow up      Team Discussion:    Balance issues-no recovering once starts to fall.  UTI being treated starting today. BP better controlled. Outing today. To go home with family who can provide 24 hr care.  Needs interpreter for therapies.   Revisions to Treatment Plan:    None    Continued Need for Acute Rehabilitation Level of Care: The patient requires daily medical management by a physician with specialized training in physical medicine and rehabilitation for the following conditions: Daily direction of a multidisciplinary physical rehabilitation program to ensure safe treatment while eliciting the highest outcome that is of practical value to the patient.: Yes Daily medical management of patient stability for increased activity during participation in an intensive rehabilitation regime.: Yes Daily analysis of laboratory values and/or radiology reports with any subsequent need for  medication adjustment of medical intervention for : Neurological problems;Other  Jean ChrisDupree, Justiss Gerbino Ward 10/02/2014, 12:59 PM          [name], female   DOB: Jul 02, 1933, 80 y.o.   MRN: [id number]

## 2014-10-10 NOTE — Discharge Summary (Signed)
Physician Discharge Summary  [name] MRN: [id number] DOB/AGE: [birth year] 80 y.o.  Admit date: 09/27/2014 Discharge date: 10/10/2014  Discharge Diagnoses:  Principal Problem:   CVA (cerebral infarction) Active Problems:   Diabetes mellitus type 2, controlled, without complications   Hemiparesis affecting right side as late effect of stroke   Dyslipidemia   Knee pain, chronic   UTI (urinary tract infection)   Discharged Condition: Stable.   Significant Diagnostic Studies: Dg Hip Complete Left  09/27/2014   CLINICAL DATA:  Left hip pain post fall 3 days ago. Pain posteriorly with limited range of motion.  EXAM: LEFT HIP - COMPLETE 2+ VIEW  COMPARISON:  None.  FINDINGS: Exam demonstrates mild symmetric degenerative change of the hips. There is no acute fracture or dislocation. There is minimal degenerative change of the spine and symphysis pubis joint.  IMPRESSION: No acute findings.   Electronically Signed   By: Elberta Fortisaniel  Boyle M.D.   On: 09/27/2014 11:55   Labs:  Basic Metabolic Panel:    Component Value Date/Time   NA 142 09/30/2014 0546   K 4.2 09/30/2014 0546   CL 103 09/30/2014 0546   CO2 25 09/30/2014 0546   GLUCOSE 116* 09/30/2014 0546   BUN 14 09/30/2014 0546   CREATININE 0.73 10/04/2014 0635   CALCIUM 9.3 09/30/2014 0546   GFRNONAA 80* 10/04/2014 0635   GFRAA >90 10/04/2014 0635     CBC: CBC Latest Ref Rng 09/30/2014 09/22/2014 09/22/2014  WBC 4.0 - 10.5 K/uL 4.9 - 5.3  Hemoglobin 12.0 - 15.0 g/dL 40.913.6 81.114.3 91.413.0  Hematocrit 36.0 - 46.0 % 40.3 42.0 39.4  Platelets 150 - 400 K/uL 189 - 149(L)     CBG:  Recent Labs Lab 10/09/14 0648 10/09/14 1117 10/09/14 1637 10/09/14 2035 10/10/14 0652  GLUCAP 93 96 113* 106* 106*    Brief HPI:   Jean Ward is a [name] with history of DM type 2, hyperlipidemia who was admitted on 09/22/14 with slurred speech, right sided weakness and right facial droop. CT head  negative for abnormality and she was treated with tPA with improvements. MRI/MRA brain done revealing widespread areas of restricted diffusion are seen throughout the brain, most notable in the LEFT paramedian pons, consistent with multiple areas of acute infarction question emboli. TEE without thrombus but positive bubble study and severe non-mobile plaque in descending thoracic aorta and distal portion of aortic arch. BLE dopplers done and negative for DVT. On 10/07, she had worsening of right sided weakness with increase in pain and numbness RLE as well as decrease in vision right eye. Loop recorder placed by Dr. Johney FrameAllred on 10/09. She has continued to complain of left hip pain and X rays done revealing mild degenerative changes and no fracture. She continues to have balance deficits with decreased coordination, poor safety and RUE inattention. CIR was recommended for follow up therapy.    Hospital Course: Jean Hooporma Calma was admitted to rehab 09/27/2014 for inpatient therapies to consist of PT, ST and OT at least three hours five days a week. Past admission physiatrist, therapy team and rehab RN have worked together to provide customized collaborative inpatient rehab. Blood pressures have been monitored on tid basis and have shown good control. Anxiety levels have improved with encouragement and ego support by the team. Left hip pain due to contusion was treated with Voltaren gel and has improved. Diabetes was monitored with ac/hs checks and blood sugars have been well controlled. She is continent  of bowel and bladder. She has made steady progress and supervision is recommended at discharge due to right inattention and safety.  She will continue to receive HHPT, HHOT and HHRN by [facility] past discharge.    Rehab course: During patient's stay in rehab weekly team conferences were held to monitor patient's progress, set goals and discuss barriers to discharge. Patient has had improvement in activity  tolerance, balance, postural control, as well as ability to compensate for deficits. She requires supervision for bathing and dressing tasks as well as mobility. She is able to ambulate > 300" with RW at supervision level. She requires occasional cues for safety with RW but no LOB noted. Family education was set but not completed as daughter did not show up for these sessions.    Disposition:  Home  Diet: Diabetic diet. Low fat. Low cholesterol.  Special Instructions: 1. Check blood sugars twice a day before meals.  2. Drink plenty of fluids daily.     Medication List    STOP taking these medications       omeprazole 20 MG capsule  Commonly known as:  PRILOSEC      TAKE these medications       aspirin EC 81 MG tablet  Take 81 mg by mouth daily.     clopidogrel 75 MG tablet  Commonly known as:  PLAVIX  Take 1 tablet (75 mg total) by mouth daily.     diclofenac sodium 1 % Gel  Commonly known as:  VOLTAREN  Apply 2 g topically 3 (three) times daily before meals.     omega-3 acid ethyl esters 1 G capsule  Commonly known as:  LOVAZA  Take 1 g by mouth daily.     pantoprazole 40 MG tablet  Commonly known as:  PROTONIX  Take 1 tablet (40 mg total) by mouth at bedtime.     senna-docusate 8.6-50 MG per tablet  Commonly known as:  Senokot-S  Take 2 tablets by mouth at bedtime. For constipation     simvastatin 40 MG tablet  Commonly known as:  ZOCOR  Take 1 tablet (40 mg total) by mouth daily.     sitaGLIPtin 100 MG tablet  Commonly known as:  JANUVIA  Take 1 tablet (100 mg total) by mouth daily.           Follow-up Information   Follow up with Erick ColaceKIRSTEINS,ANDREW E, MD On 11/01/2014. (Be there at 9:30  for 10am  appointment )    Specialty:  Physical Medicine and Rehabilitation   Contact information:   [address] [place] [address] [phone]       Follow up with SETHI,PRAMOD, MD. Call today. (for follow up appointment in 6 weeks. )     Specialties:  Neurology, Radiology   Contact information:   [address] [place] [address] [phone]       Follow up with Darrow BussingKOIRALA,DIBAS, MD On 10/18/2014. (APPT @ 11:45 Am)    Specialty:  Family Medicine   Contact information:   [address] [place] [address] [phone]       Signed: Jacquelynn CreeLove, Latavia Goga S 10/10/2014, 10:02 AM

## 2014-10-10 NOTE — Discharge Instructions (Signed)
Inpatient Rehab Discharge Instructions  Jean Ward Discharge date and time:  10/10/14  Activities/Precautions/ Functional Status: Activity: activity as tolerated Diet: low fat, low cholesterol diet. Diabetic restrictions.  Wound Care: none needed  Functional status:  ___ No restrictions     ___ Walk up steps independently _X__ 24/7 supervision/assistance   ___ Walk up steps with assistance ___ Intermittent supervision/assistance  ___ Bathe/dress independently ___ Walk with walker     _X__ Bathe/dress with assistance ___ Walk Independently    ___ Shower independently ___ Walk with assistance    ___ Shower with assistance _X__ No alcohol     ___ Return to work/school ________  Special Instructions: 1. Check blood sugars twice a day before meals.  2. Drink plenty of fluids daily.  COMMUNITY REFERRALS UPON DISCHARGE:    Home Health:   PT, OT, RN  Agency:[facility] Phone:[phone] Date of last service:10/10/2014  Medical Equipment/Items Ordered:YOUTH Levan HurstROLLING WALKER & TUB BENCH  Agency/Supplier:[facility]    [phone]   STROKE/TIA DISCHARGE INSTRUCTIONS SMOKING Cigarette smoking nearly doubles your risk of having a stroke & is the single most alterable risk factor  If you smoke or have smoked in the last 12 months, you are advised to quit smoking for your health.  Most of the excess cardiovascular risk related to smoking disappears within a year of stopping.  Ask you doctor about anti-smoking medications  [place] Quit Line: 1-800-QUIT NOW  Free Smoking Cessation Classes [phone]  CHOLESTEROL Know your levels; limit fat & cholesterol in your diet  Lipid Panel     Component Value Date/Time   CHOL 156 09/22/2014 0402   TRIG 80 09/22/2014 0402   HDL 48 09/22/2014 0402   CHOLHDL 3.3 09/22/2014 0402   VLDL 16 09/22/2014 0402   LDLCALC 92 09/22/2014 0402      Many patients benefit from treatment even if their cholesterol is at goal.  Goal: Total Cholesterol  (CHOL) less than 160  Goal:  Triglycerides (TRIG) less than 150  Goal:  HDL greater than 40  Goal:  LDL (LDLCALC) less than 100   BLOOD PRESSURE American Stroke Association blood pressure target is less that 120/80 mm/Hg  Your discharge blood pressure is:  BP: 132/55 mmHg  Monitor your blood pressure  Limit your salt and alcohol intake  Many individuals will require more than one medication for high blood pressure  DIABETES (A1c is a blood sugar average for last 3 months) Goal HGBA1c is under 7% (HBGA1c is blood sugar average for last 3 months)  Diabetes:     Lab Results  Component Value Date   HGBA1C 6.0* 09/22/2014     Your HGBA1c can be lowered with medications, healthy diet, and exercise.  Check your blood sugar as directed by your physician  Call your physician if you experience unexplained or low blood sugars.  PHYSICAL ACTIVITY/REHABILITATION Goal is 30 minutes at least 4 days per week  Activity: No driving, Therapies: See above Return to work: N/A  Activity decreases your risk of heart attack and stroke and makes your heart stronger.  It helps control your weight and blood pressure; helps you relax and can improve your mood.  Participate in a regular exercise program.  Talk with your doctor about the best form of exercise for you (dancing, walking, swimming, cycling).  DIET/WEIGHT Goal is to maintain a healthy weight  Your discharge diet is: Carb Control thin liquids Your height is:  Height: 5' (152.4 cm) Your current weight is:  Weight: 78.2 kg (172 lb 6.4 oz) Your Body Mass Index (BMI) is:  BMI (Calculated): 33.7  Following the type of diet specifically designed for you will help prevent another stroke.  Your goal weight  is: 128 lbs  Your goal Body Mass Index (BMI) is 19-24.  Healthy food habits can help reduce 3 risk factors for stroke:  High cholesterol, hypertension, and excess weight.  RESOURCES Stroke/Support Group:  Call [phone]   STROKE  EDUCATION PROVIDED/REVIEWED AND GIVEN TO PATIENT Stroke warning signs and symptoms How to activate emergency medical system (call 911). Medications prescribed at discharge. Need for follow-up after discharge. Personal risk factors for stroke. Pneumonia vaccine given:  Flu vaccine given:  My questions have been answered, the writing is legible, and I understand these instructions.  I will adhere to these goals & educational materials that have been provided to me after my discharge from the hospital.      My questions have been answered and I understand these instructions. I will adhere to these goals and the provided educational materials after my discharge from the hospital.  Patient/Caregiver Signature _______________________________ Date __________  Clinician Signature _______________________________________ Date __________  Please bring this form and your medication list with you to all your follow-up doctor's appointments.

## 2014-10-10 NOTE — Progress Notes (Signed)
Social Work Discharge Note Discharge Note  The overall goal for the admission was met for:   Discharge location: Yes-HOME WITH LOCAL DAUGHTER THEN GO TO ANOTHER DAUGHTER'S IN [place]  Length of Stay: Yes-13 DAYS  Discharge activity level: Yes-SUPERVISION LEVEL  Home/community participation: Yes  Services provided included: MD, RD, PT, OT, SLP, RN, CM, TR, Pharmacy and SW  Financial Services: Medicare and Medicaid  Follow-up services arranged: Home Health: [facility]-PT,OT,RN, DME: [facility] and Patient/Family has no preference for HH/DME agencies  Comments (or additional information):DAUGHTER DID NOT SHOW UP FOR EDUCATION HAD OVER THE WEEKEND.  FEELS [place]  Patient/Family verbalized understanding of follow-up arrangements: Yes  Individual responsible for coordination of the follow-up plan: EVELYN-DAUGHTER  Confirmed correct DME delivered: Elease Hashimoto 10/10/2014    Elease Hashimoto

## 2014-10-10 NOTE — Progress Notes (Signed)
Social Work [name], female   DOB: March 31, 1933, 80 y.o.   MRN: [id number] Spoke with daughter late yesterday she needs FMLA papers completed before she can come in for education.  Have asked Pam-PA when will be completed She expressed later today.  Discussed daughter taking the day off and getting the papers when here.  Unsure what daughter Jean Ward[plans to do, may not show up for education today.

## 2014-10-10 NOTE — Progress Notes (Signed)
[name], female   DOB: 1933/06/15, 80 y.o.   MRN: [id number]   Subjective/Complaints: Pt excited about D/C Has Left knee pain , no trauma, had relief with "arthritis cream" at home     Difficult to get ROS secondary to language Objective: Vital Signs: Blood pressure 132/55, pulse 59, temperature 98.5 F (36.9 C), temperature source Oral, resp. rate 17, weight 78.2 kg (172 lb 6.4 oz), SpO2 98.00%. No results found. Results for orders placed during the hospital encounter of 09/27/14 (from the past 72 hour(s))  GLUCOSE, CAPILLARY     Status: Abnormal   Collection Time    10/07/14 11:17 AM      Result Value Ref Range   Glucose-Capillary 107 (*) 70 - 99 mg/dL  GLUCOSE, CAPILLARY     Status: Abnormal   Collection Time    10/07/14  4:27 PM      Result Value Ref Range   Glucose-Capillary 103 (*) 70 - 99 mg/dL   Comment 1 Notify RN    GLUCOSE, CAPILLARY     Status: Abnormal   Collection Time    10/07/14  8:33 PM      Result Value Ref Range   Glucose-Capillary 132 (*) 70 - 99 mg/dL  GLUCOSE, CAPILLARY     Status: Abnormal   Collection Time    10/08/14  6:42 AM      Result Value Ref Range   Glucose-Capillary 109 (*) 70 - 99 mg/dL  GLUCOSE, CAPILLARY     Status: None   Collection Time    10/08/14 10:59 AM      Result Value Ref Range   Glucose-Capillary 87  70 - 99 mg/dL  GLUCOSE, CAPILLARY     Status: None   Collection Time    10/08/14  4:14 PM      Result Value Ref Range   Glucose-Capillary 95  70 - 99 mg/dL   Comment 1 Notify RN    GLUCOSE, CAPILLARY     Status: Abnormal   Collection Time    10/08/14  9:17 PM      Result Value Ref Range   Glucose-Capillary 111 (*) 70 - 99 mg/dL  GLUCOSE, CAPILLARY     Status: None   Collection Time    10/09/14  6:48 AM      Result Value Ref Range   Glucose-Capillary 93  70 - 99 mg/dL  GLUCOSE, CAPILLARY     Status: None   Collection Time    10/09/14 11:17 AM      Result Value Ref Range   Glucose-Capillary 96  70  - 99 mg/dL  GLUCOSE, CAPILLARY     Status: Abnormal   Collection Time    10/09/14  4:37 PM      Result Value Ref Range   Glucose-Capillary 113 (*) 70 - 99 mg/dL  GLUCOSE, CAPILLARY     Status: Abnormal   Collection Time    10/09/14  8:35 PM      Result Value Ref Range   Glucose-Capillary 106 (*) 70 - 99 mg/dL     HEENT: normal Cardio: RRR and no murmur Resp: CTA B/L and unlabored GI: BS positive and NT,ND Extremity:  Pulses positive and No Edema Skin:   Intact and Loop recorder site without active bleeding + ecchymosis, no tenderness Neuro: Cranial Nerve II-XII normal, Abnormal Motor 4/5 R UE, 5/5 in LUE and BLE and Abnormal FMC Ataxic/ dec FMC Musc/Skel:  Neck tender Gen NAD Mood/affect bright , smiling  Assessment/Plan: 1. 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 Stable for D/C today F/u PCP in 1-2 weeks F/u PM&R 3 weeks See D/C summary See D/C instructions FIM: FIM - Bathing Bathing Steps Patient Completed: Chest;Right Arm;Left Arm;Abdomen;Left upper leg;Right upper leg;Buttocks;Front perineal area;Right lower leg (including foot);Left lower leg (including foot) Bathing: 5: Supervision: Safety issues/verbal cues  FIM - Upper Body Dressing/Undressing Upper body dressing/undressing steps patient completed: Thread/unthread right sleeve of pullover shirt/dresss;Thread/unthread left sleeve of pullover shirt/dress;Put head through opening of pull over shirt/dress;Pull shirt over trunk Upper body dressing/undressing: 5: Supervision: Safety issues/verbal cues FIM - Lower Body Dressing/Undressing Lower body dressing/undressing steps patient completed: Thread/unthread right underwear leg;Thread/unthread left underwear leg;Pull underwear up/down;Thread/unthread right pants leg;Thread/unthread left pants leg;Pull pants up/down;Fasten/unfasten pants;Don/Doff right sock;Don/Doff left sock;Don/Doff right shoe;Don/Doff left shoe Lower  body dressing/undressing: 5: Supervision: Safety issues/verbal cues  FIM - Toileting Toileting steps completed by patient: Adjust clothing prior to toileting;Adjust clothing after toileting;Performs perineal hygiene Toileting Assistive Devices: Grab bar or rail for support Toileting: 5: Supervision: Safety issues/verbal cues  FIM - Diplomatic Services operational officerToilet Transfers Toilet Transfers Assistive Devices: Grab bars;Walker Toilet Transfers: 5-To toilet/BSC: Supervision (verbal cues/safety issues);5-From toilet/BSC: Supervision (verbal cues/safety issues)  FIM - BankerBed/Chair Transfer Bed/Chair Transfer Assistive Devices: Therapist, occupationalWalker Bed/Chair Transfer: 5: Chair or W/C > Bed: Supervision (verbal cues/safety issues);5: Bed > Chair or W/C: Supervision (verbal cues/safety issues)  FIM - Locomotion: Wheelchair Distance:  (pt ambulatory did not assess) Locomotion: Wheelchair: 0: Activity did not occur (pt ambulatory) FIM - Locomotion: Ambulation Locomotion: Ambulation Assistive Devices: Designer, industrial/productWalker - Rolling Ambulation/Gait Assistance: 5: Supervision Locomotion: Ambulation: 5: Travels 150 ft or more with supervision/safety issues  Comprehension Comprehension Mode: Auditory Comprehension: 5-Understands complex 90% of the time/Cues < 10% of the time  Expression Expression Mode: Verbal Expression Assistive Devices: 6-Other (Comment) (interpreter) Expression: 5-Expresses basic needs/ideas: With no assist  Social Interaction Social Interaction: 7-Interacts appropriately with others - No medications needed.  Problem Solving Problem Solving: 5-Solves basic 90% of the time/requires cueing < 10% of the time  Memory Memory: 5-Recognizes or recalls 90% of the time/requires cueing < 10% of the time  Medical Problem List and Plan:  1. 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  2. DVT Prophylaxis/Anticoagulation: Pharmaceutical: Lovenox  3. Pain Management: Tylenol or tramadol  prn for hip/knee pain.  4. Mood: LCSW to follow for evaluation and support.  5. Neuropsych: This patient is capable of making decisions on her own behalf.  6. Skin/Wound Care: Pressure relief measures.  7. Fluids/Electrolytes/Nutrition: Monitor I/O. Encourage fluid intake.  8. Blood pressure: Monitor every 8 hours.  9. DM type 2:Controlled Hgb A1c-6.0. Will monitor with ac/hs checks. Continue trajenta with SSI for elevated BS.  10. Chronic back pain/Left hip contusion, improved with conservative care 11.  Neck pain resolved 12.  UTI sens Keflex , completed tx LOS (Days) 13 A FACE TO FACE EVALUATION WAS PERFORMED  KIRSTEINS,ANDREW E 10/10/2014, 6:48 AM

## 2014-10-10 NOTE — Progress Notes (Signed)
1620 10/10/14 nursing  Patient discharged to home per wheelchair accompanied by family and NS ; pt watch video re: loop recorder before dischartge per PA. Discharge done by PA patient and family understood.

## 2014-10-10 NOTE — Progress Notes (Signed)
Physical Therapy Note  Patient Details  Name: Jean Ward MRN: [id number] Date of Birth: 1933/06/01 Today's Date: 10/10/2014    Pt missed 30 mins of family education session this morning due to no family present.  Discussed with pt and interpretor that if she had any questions that she or daughter could ask CSW or call back for any questions.    Jean Ward, [place] [employer] Ann 10/10/2014, 12:59 PM

## 2014-10-23 DIAGNOSIS — E119 Type 2 diabetes mellitus without complications: Secondary | ICD-10-CM | POA: Diagnosis not present

## 2014-10-23 DIAGNOSIS — E785 Hyperlipidemia, unspecified: Secondary | ICD-10-CM | POA: Diagnosis not present

## 2014-10-23 DIAGNOSIS — I69851 Hemiplegia and hemiparesis following other cerebrovascular disease affecting right dominant side: Secondary | ICD-10-CM | POA: Diagnosis not present

## 2014-11-01 ENCOUNTER — Encounter: Payer: Medicare Other | Attending: Physical Medicine & Rehabilitation

## 2014-11-01 ENCOUNTER — Inpatient Hospital Stay: Payer: Medicare Other | Admitting: Physical Medicine & Rehabilitation

## 2014-11-04 ENCOUNTER — Encounter: Payer: Self-pay | Admitting: Physical Medicine & Rehabilitation

## 2014-11-08 ENCOUNTER — Ambulatory Visit (INDEPENDENT_AMBULATORY_CARE_PROVIDER_SITE_OTHER): Payer: Medicare Other | Admitting: *Deleted

## 2014-11-08 DIAGNOSIS — I639 Cerebral infarction, unspecified: Secondary | ICD-10-CM

## 2014-11-09 LAB — MDC_IDC_ENUM_SESS_TYPE_REMOTE
Date Time Interrogation Session: 20151111034654
MDC IDC SET ZONE DETECTION INTERVAL: 3000 ms
Zone Setting Detection Interval: 2000 ms
Zone Setting Detection Interval: 390 ms

## 2014-11-12 NOTE — Progress Notes (Signed)
Loop recorder 

## 2014-11-21 ENCOUNTER — Encounter: Payer: Self-pay | Admitting: Internal Medicine

## 2014-11-28 ENCOUNTER — Encounter (HOSPITAL_COMMUNITY): Payer: Self-pay | Admitting: Internal Medicine

## 2014-12-09 ENCOUNTER — Ambulatory Visit (INDEPENDENT_AMBULATORY_CARE_PROVIDER_SITE_OTHER): Payer: Medicare Other | Admitting: *Deleted

## 2014-12-09 DIAGNOSIS — I639 Cerebral infarction, unspecified: Secondary | ICD-10-CM

## 2014-12-10 NOTE — Progress Notes (Signed)
Loop recorder 

## 2014-12-31 LAB — MDC_IDC_ENUM_SESS_TYPE_REMOTE

## 2015-01-08 ENCOUNTER — Ambulatory Visit (INDEPENDENT_AMBULATORY_CARE_PROVIDER_SITE_OTHER): Payer: Medicare Other | Admitting: *Deleted

## 2015-01-08 DIAGNOSIS — I639 Cerebral infarction, unspecified: Secondary | ICD-10-CM

## 2015-01-09 NOTE — Progress Notes (Signed)
Loop recorder 

## 2015-01-16 ENCOUNTER — Encounter: Payer: Self-pay | Admitting: Internal Medicine

## 2015-01-27 ENCOUNTER — Telehealth: Payer: Self-pay | Admitting: Cardiology

## 2015-01-27 NOTE — Telephone Encounter (Signed)
Informed pt daughter that home monitor monitors pt device every night while she sleeps, and that the loop recorder will last for 3 years. Pt daughter verbalized understanding.

## 2015-01-28 LAB — MDC_IDC_ENUM_SESS_TYPE_REMOTE

## 2015-02-07 ENCOUNTER — Ambulatory Visit (INDEPENDENT_AMBULATORY_CARE_PROVIDER_SITE_OTHER): Payer: Medicare Other | Admitting: *Deleted

## 2015-02-07 DIAGNOSIS — I639 Cerebral infarction, unspecified: Secondary | ICD-10-CM

## 2015-02-07 LAB — MDC_IDC_ENUM_SESS_TYPE_REMOTE
Date Time Interrogation Session: 20160218233113
Zone Setting Detection Interval: 2000 ms
Zone Setting Detection Interval: 3000 ms
Zone Setting Detection Interval: 390 ms

## 2015-02-10 DIAGNOSIS — I639 Cerebral infarction, unspecified: Secondary | ICD-10-CM

## 2015-02-10 NOTE — Progress Notes (Signed)
Loop recorder 

## 2015-02-13 ENCOUNTER — Encounter: Payer: Self-pay | Admitting: Internal Medicine

## 2015-03-10 ENCOUNTER — Encounter: Payer: Self-pay | Admitting: Internal Medicine

## 2015-03-10 ENCOUNTER — Ambulatory Visit (INDEPENDENT_AMBULATORY_CARE_PROVIDER_SITE_OTHER): Payer: Medicare Other | Admitting: *Deleted

## 2015-03-10 DIAGNOSIS — I639 Cerebral infarction, unspecified: Secondary | ICD-10-CM

## 2015-03-11 LAB — MDC_IDC_ENUM_SESS_TYPE_REMOTE
MDC IDC SESS DTM: 20160331003939
MDC IDC SET ZONE DETECTION INTERVAL: 3000 ms
Zone Setting Detection Interval: 2000 ms
Zone Setting Detection Interval: 390 ms

## 2015-03-14 NOTE — Progress Notes (Signed)
Loop recorder 

## 2015-04-08 ENCOUNTER — Ambulatory Visit (INDEPENDENT_AMBULATORY_CARE_PROVIDER_SITE_OTHER): Payer: Medicare Other | Admitting: *Deleted

## 2015-04-08 ENCOUNTER — Encounter: Payer: Self-pay | Admitting: Internal Medicine

## 2015-04-08 DIAGNOSIS — I639 Cerebral infarction, unspecified: Secondary | ICD-10-CM | POA: Diagnosis not present

## 2015-04-09 NOTE — Progress Notes (Signed)
Loop recorder 

## 2015-05-06 LAB — CUP PACEART REMOTE DEVICE CHECK: Date Time Interrogation Session: 20160517162125

## 2015-05-08 ENCOUNTER — Ambulatory Visit (INDEPENDENT_AMBULATORY_CARE_PROVIDER_SITE_OTHER): Payer: Medicare Other | Admitting: *Deleted

## 2015-05-08 DIAGNOSIS — I639 Cerebral infarction, unspecified: Secondary | ICD-10-CM

## 2015-05-09 NOTE — Progress Notes (Signed)
Loop recorder 

## 2015-05-22 ENCOUNTER — Encounter: Payer: Self-pay | Admitting: Internal Medicine

## 2015-05-22 LAB — CUP PACEART REMOTE DEVICE CHECK: MDC IDC SESS DTM: 20160602144806

## 2015-06-02 ENCOUNTER — Encounter: Payer: Self-pay | Admitting: Internal Medicine

## 2015-06-06 ENCOUNTER — Ambulatory Visit (INDEPENDENT_AMBULATORY_CARE_PROVIDER_SITE_OTHER): Payer: Medicare Other | Admitting: *Deleted

## 2015-06-06 DIAGNOSIS — I639 Cerebral infarction, unspecified: Secondary | ICD-10-CM | POA: Diagnosis not present

## 2015-06-09 NOTE — Progress Notes (Signed)
Loop recorder 

## 2015-06-16 LAB — CUP PACEART REMOTE DEVICE CHECK: Date Time Interrogation Session: 20160627131554

## 2015-06-30 ENCOUNTER — Encounter: Payer: Self-pay | Admitting: Internal Medicine

## 2015-07-07 ENCOUNTER — Ambulatory Visit (INDEPENDENT_AMBULATORY_CARE_PROVIDER_SITE_OTHER): Payer: Medicare Other | Admitting: *Deleted

## 2015-07-07 DIAGNOSIS — I639 Cerebral infarction, unspecified: Secondary | ICD-10-CM

## 2015-07-08 NOTE — Progress Notes (Signed)
Loop recorder 

## 2015-07-11 LAB — CUP PACEART REMOTE DEVICE CHECK: Date Time Interrogation Session: 20160722135038

## 2015-07-31 ENCOUNTER — Encounter: Payer: Self-pay | Admitting: Internal Medicine

## 2015-08-06 ENCOUNTER — Ambulatory Visit (INDEPENDENT_AMBULATORY_CARE_PROVIDER_SITE_OTHER): Payer: Medicare Other | Admitting: *Deleted

## 2015-08-06 DIAGNOSIS — I639 Cerebral infarction, unspecified: Secondary | ICD-10-CM | POA: Diagnosis not present

## 2015-08-07 NOTE — Progress Notes (Signed)
Loop recorder 

## 2015-08-18 LAB — CUP PACEART REMOTE DEVICE CHECK: Date Time Interrogation Session: 20160829091253

## 2015-08-27 ENCOUNTER — Encounter: Payer: Self-pay | Admitting: Internal Medicine

## 2015-09-05 ENCOUNTER — Ambulatory Visit (INDEPENDENT_AMBULATORY_CARE_PROVIDER_SITE_OTHER): Payer: Medicare Other | Admitting: *Deleted

## 2015-09-05 DIAGNOSIS — I639 Cerebral infarction, unspecified: Secondary | ICD-10-CM | POA: Diagnosis not present

## 2015-09-09 NOTE — Progress Notes (Signed)
Loop recorder 

## 2015-09-12 LAB — CUP PACEART REMOTE DEVICE CHECK: Date Time Interrogation Session: 20160916233519

## 2015-09-12 NOTE — Progress Notes (Signed)
[facility] summary report received. Battery status OK. Normal device function. No new symptom episodes, tachy episodes, brady, or pause episodes. No new AF episodes. Monthly summary reports and ROV with JA PRN. 

## 2015-09-25 ENCOUNTER — Encounter: Payer: Self-pay | Admitting: Cardiology

## 2015-09-25 ENCOUNTER — Encounter: Payer: Self-pay | Admitting: Internal Medicine

## 2015-10-01 ENCOUNTER — Encounter: Payer: Self-pay | Admitting: Cardiology

## 2015-10-06 ENCOUNTER — Ambulatory Visit (INDEPENDENT_AMBULATORY_CARE_PROVIDER_SITE_OTHER): Payer: Medicare Other | Admitting: *Deleted

## 2015-10-06 DIAGNOSIS — I639 Cerebral infarction, unspecified: Secondary | ICD-10-CM | POA: Diagnosis not present

## 2015-10-09 ENCOUNTER — Encounter: Payer: Self-pay | Admitting: Cardiology

## 2015-10-09 NOTE — Progress Notes (Signed)
LOOP RECORDER  

## 2015-10-16 ENCOUNTER — Encounter: Payer: Self-pay | Admitting: Cardiology

## 2015-10-23 LAB — CUP PACEART REMOTE DEVICE CHECK: Date Time Interrogation Session: 20161016233705

## 2015-10-23 NOTE — Progress Notes (Signed)
[facility] summary report received. Battery status OK. Normal device function. No new symptom episodes, tachy episodes, brady, or pause episodes. No new AF episodes. Monthly summary reports and ROV with JA PRN. 

## 2015-10-24 ENCOUNTER — Encounter: Payer: Self-pay | Admitting: Cardiology

## 2015-10-30 ENCOUNTER — Encounter: Payer: Self-pay | Admitting: Cardiology

## 2015-11-04 ENCOUNTER — Encounter: Payer: Medicare Other | Admitting: *Deleted

## 2015-11-06 ENCOUNTER — Encounter: Payer: Self-pay | Admitting: Cardiology

## 2015-11-20 ENCOUNTER — Encounter: Payer: Self-pay | Admitting: Cardiology

## 2015-11-28 ENCOUNTER — Encounter: Payer: Self-pay | Admitting: Cardiology

## 2015-12-04 ENCOUNTER — Encounter: Payer: Self-pay | Admitting: Cardiology

## 2015-12-11 ENCOUNTER — Encounter: Payer: Self-pay | Admitting: Cardiology

## 2016-02-23 IMAGING — CT CT HEAD W/O CM
2 series · 15 of 30 positions shown, 19 images · non-contrast
Comparison: None.

CLINICAL DATA: Code stroke. Slurred speech and right-sided weakness
beginning at 2 a.m..

EXAM:
CT HEAD WITHOUT CONTRAST
TECHNIQUE: Contiguous axial images were obtained from the base of the skull
through the vertex without intravenous contrast.

[Series 201: head w/o, idose (1) · axial · non-contrast · 0.49mm/px · z∈[+32,+157]mm · 13 of 31 slices shown, 17 images]
[im 3/31  brain]
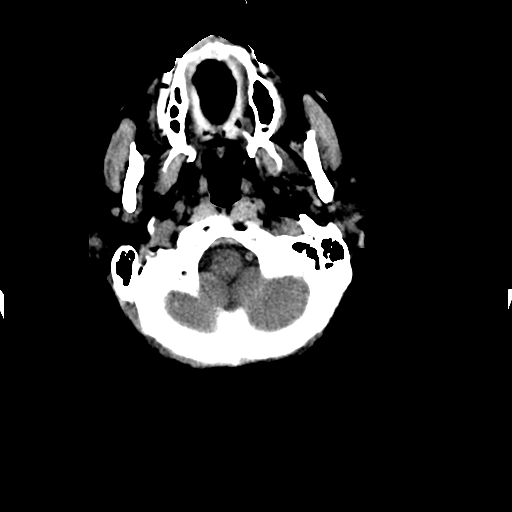
[im 3/31  bone]
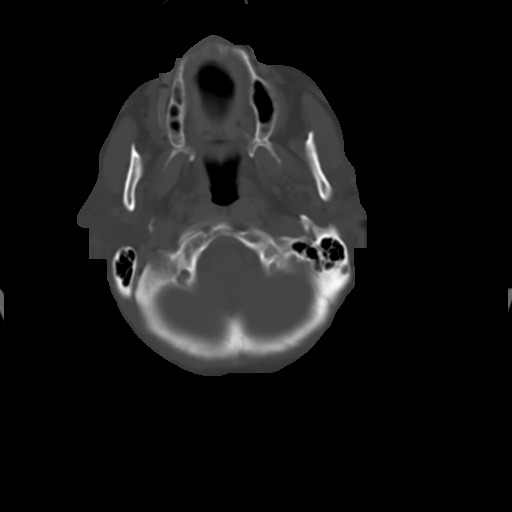
[im 5/31  brain]
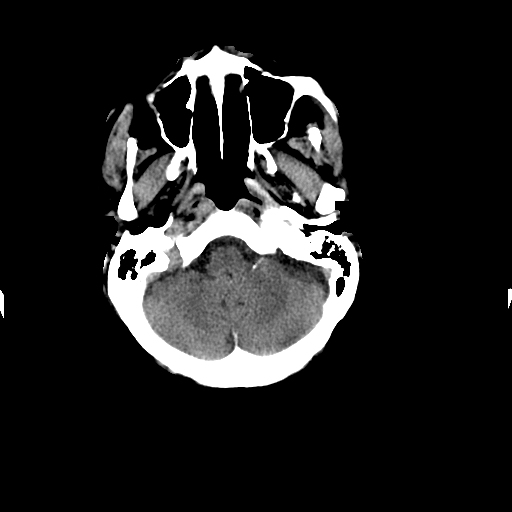
[im 7/31  brain]
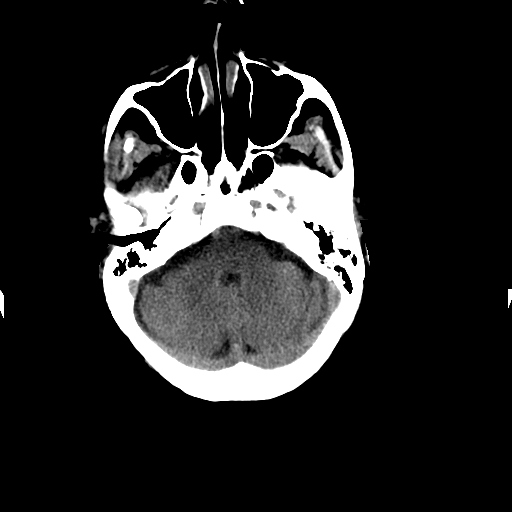
[im 9/31  brain]
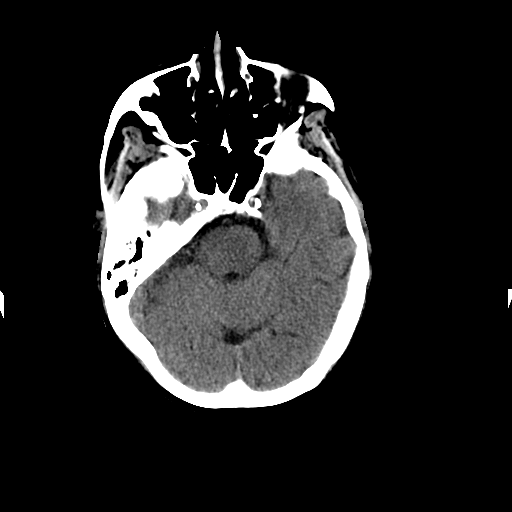
[im 11/31  brain]
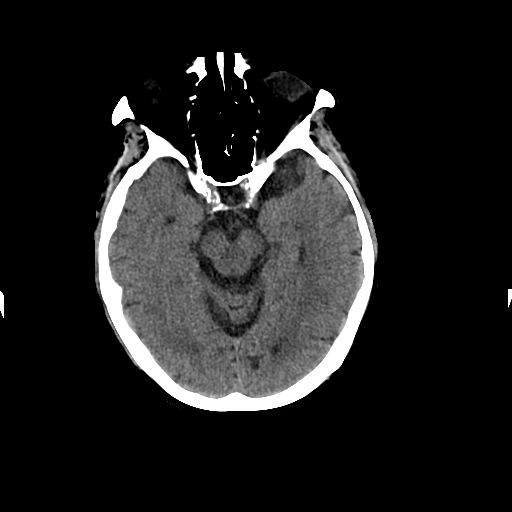
[im 11/31  bone]
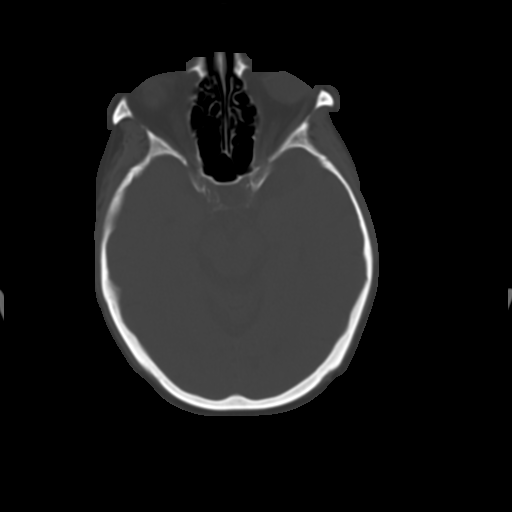
[im 13/31  brain]
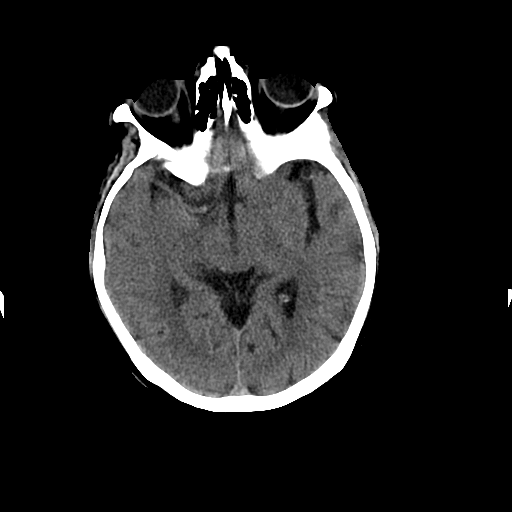
[im 16/31  brain]
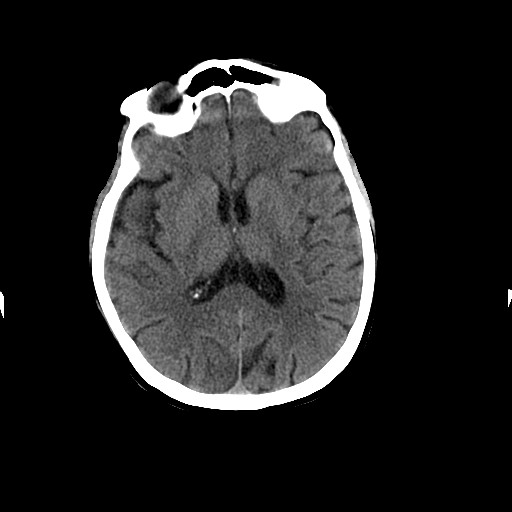
[im 18/31  brain]
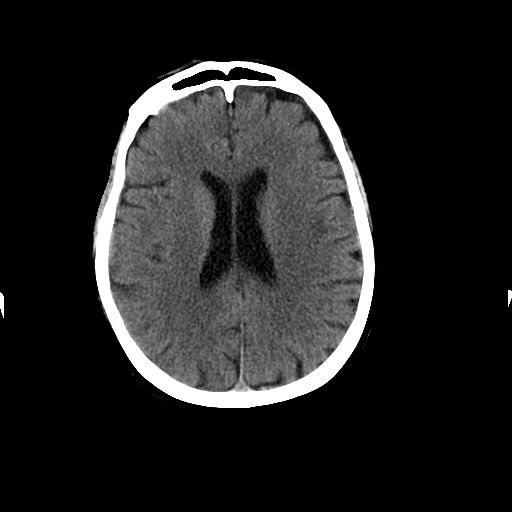
[im 20/31  brain]
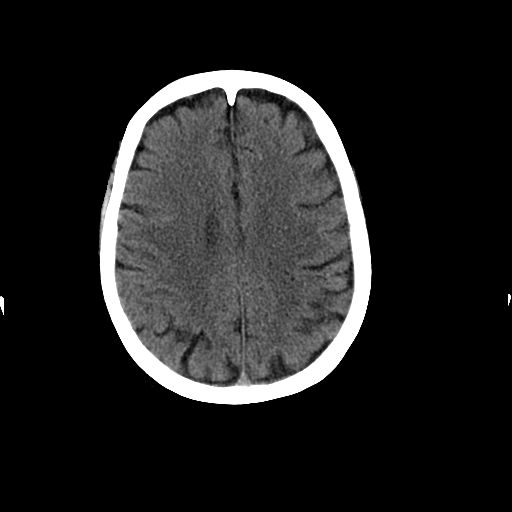
[im 20/31  bone]
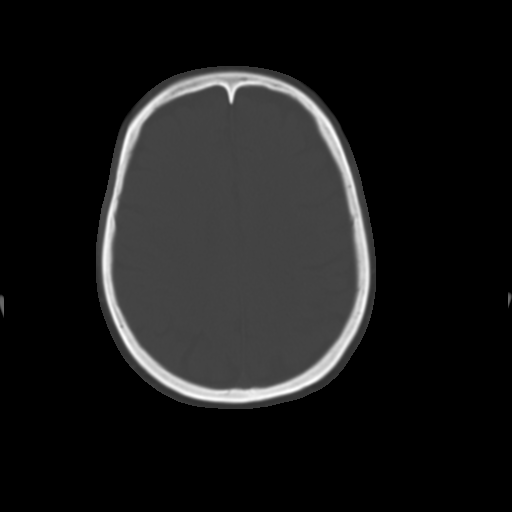
[im 22/31  brain]
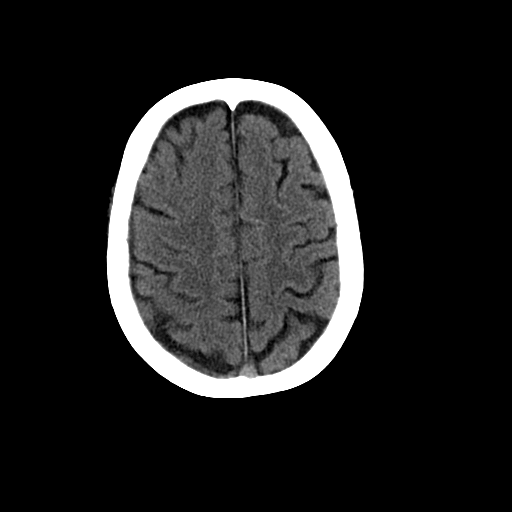
[im 24/31  brain]
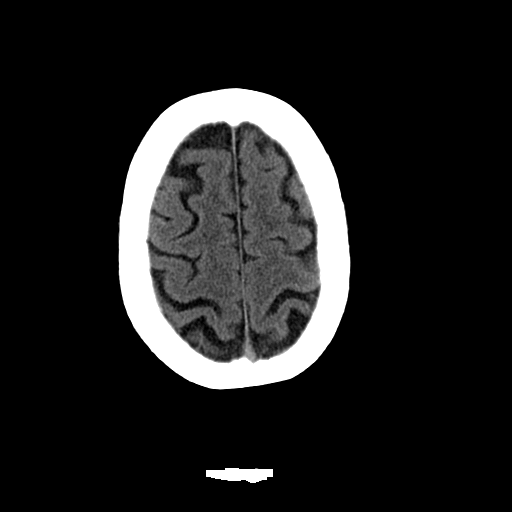
[im 26/31  brain]
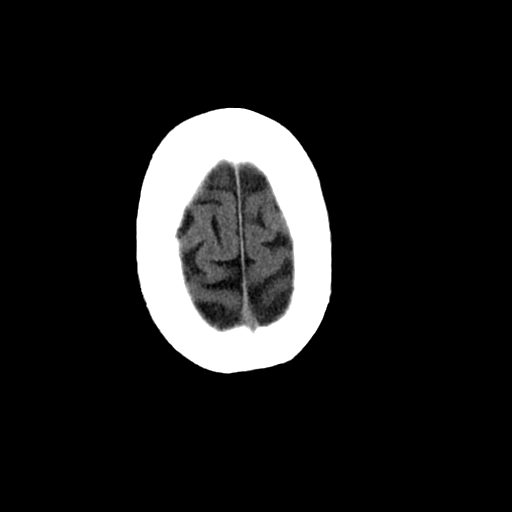
[im 28/31  brain]
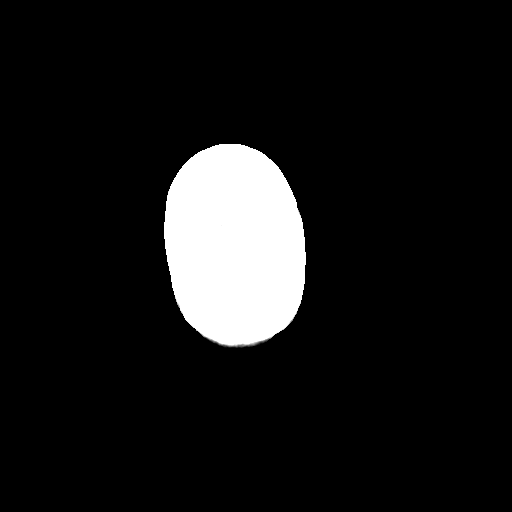
[im 28/31  bone]
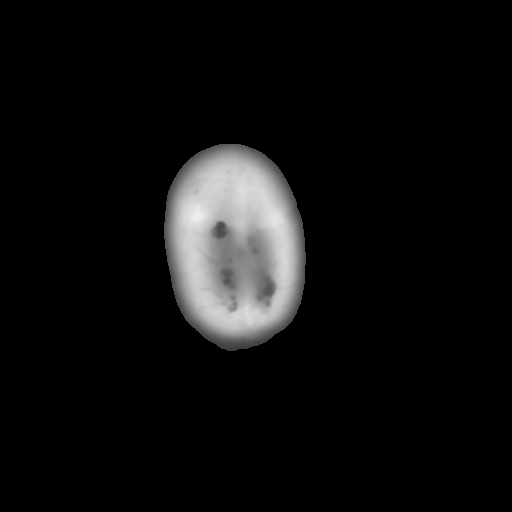

[Series 202: head w/o bone, idose (1) · axial · non-contrast · 0.49mm/px · z∈[+32,+52]mm · 2 of 31 slices shown]
[im 3/31  bone]
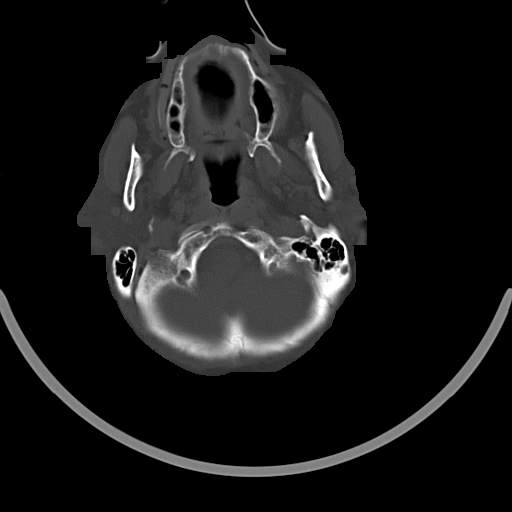
[im 7/31  bone]
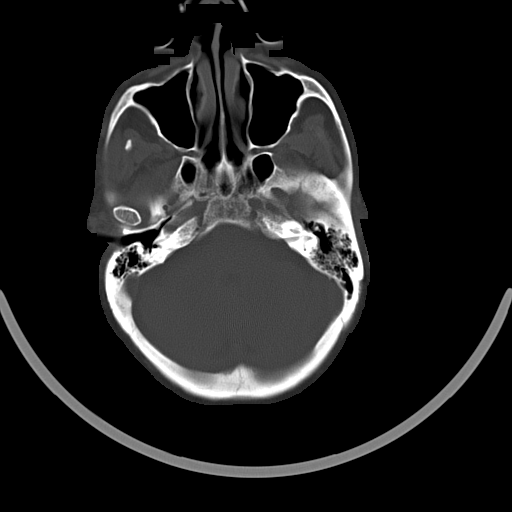

[15 of 30 positions shown; findings below may reference images not displayed]

FINDINGS: Diffuse cerebral atrophy. No significant ventricular dilatation.
Mild low-attenuation changes in the deep white matter suggesting
small vessel ischemia. No mass effect or midline shift. No abnormal
extra-axial fluid collections. Gray-white matter junctions are
distinct. Basal cisterns are not effaced. No evidence of acute
intracranial hemorrhage. No depressed skull fractures. Visualized
paranasal sinuses and mastoid air cells are not opacified.
IMPRESSION: No acute intracranial abnormalities. Mild atrophy and small vessel
ischemic changes.

These results were called by telephone at the time of interpretation
on 09/22/2014 at [DATE] to Dr. ZOTRAN JAKSIC , who verbally
acknowledged these results.

## 2016-02-23 IMAGING — MR MR HEAD W/O CM
9 of 11 series · 27 of 48 positions shown · non-contrast
Comparison: CT head earlier in the day

CLINICAL DATA: RIGHT-sided weakness and dysarthria which began at
midnight 09/22/2014. Stroke risk factors include diabetes and
hypertension. TPA was administered

EXAM:
MRI HEAD WITHOUT CONTRAST
MRA HEAD WITHOUT CONTRAST
TECHNIQUE: Multiplanar, multiecho pulse sequences of the brain and surrounding
structures were obtained without intravenous contrast. Angiographic
images of the head were obtained using MRA technique without
contrast.

[Series 2: FLAIR · sagittal · 5.0mm · 0.47mm/px · 2 of 23 slices shown (1 of 2)]
[im 1/23]
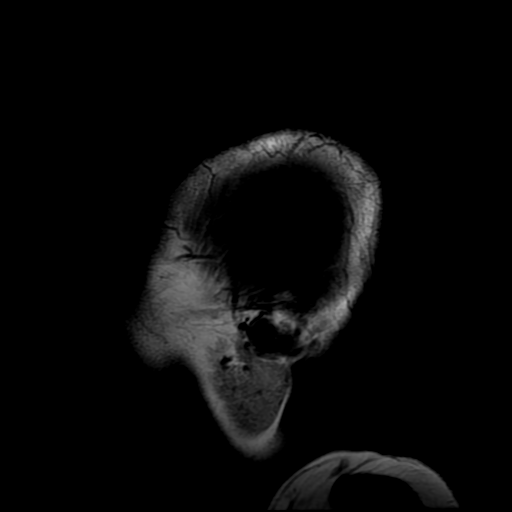
[im 23/23]
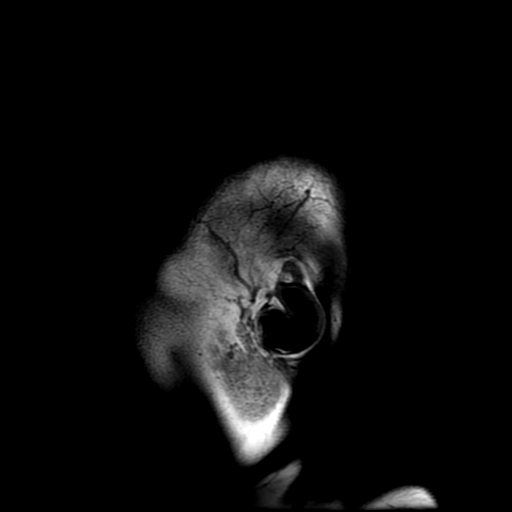

[Series 4: DWI · axial · 5.0mm · 1.02mm/px · z∈[-89,+60]mm · 4 of 62 slices shown (1 of 4)]
[im 1/62]
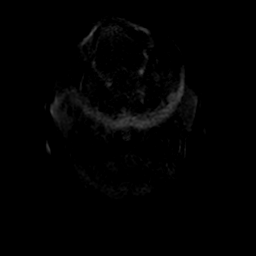
[im 21/62]
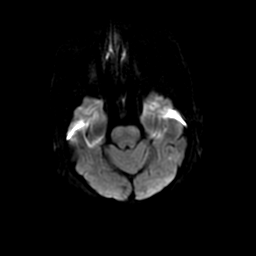
[im 41/62]
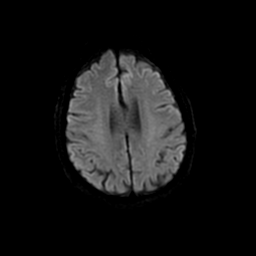
[im 62/62]
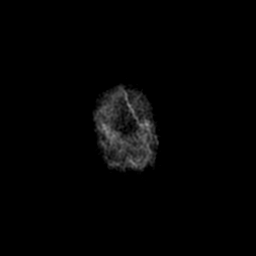

[Series 5: ax (id) 2 · axial · 1.4mm · 0.43mm/px · z∈[-113,-43]mm · 6 of 152 slices shown]
[im 1/152]
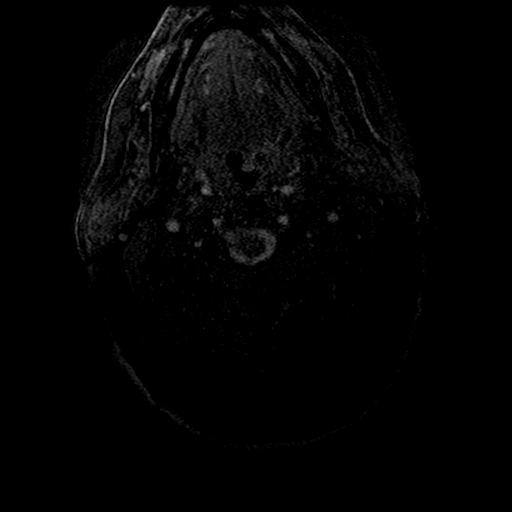
[im 17/152]
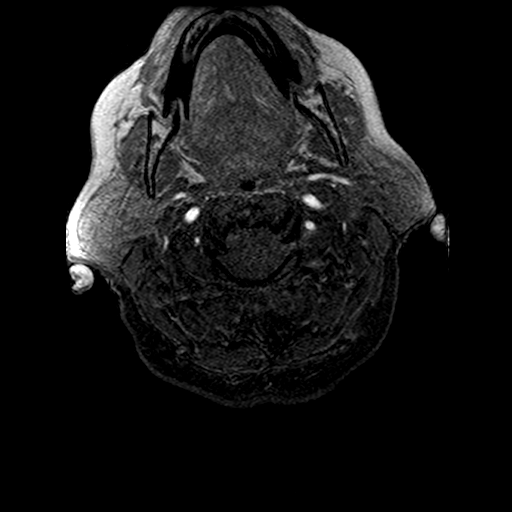
[im 51/152]
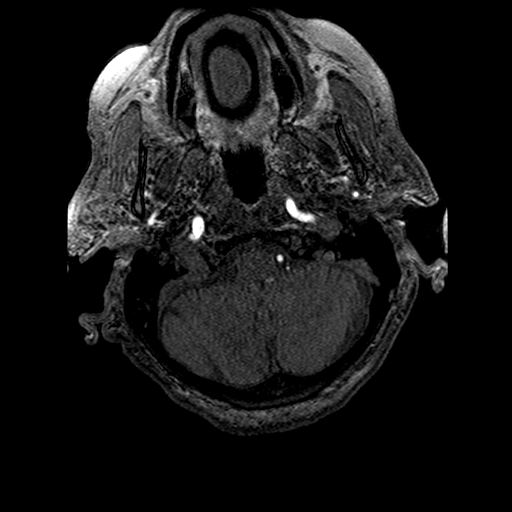
[im 68/152]
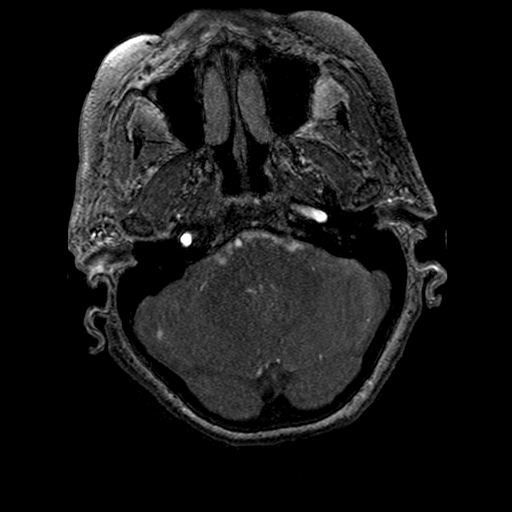
[im 84/152]
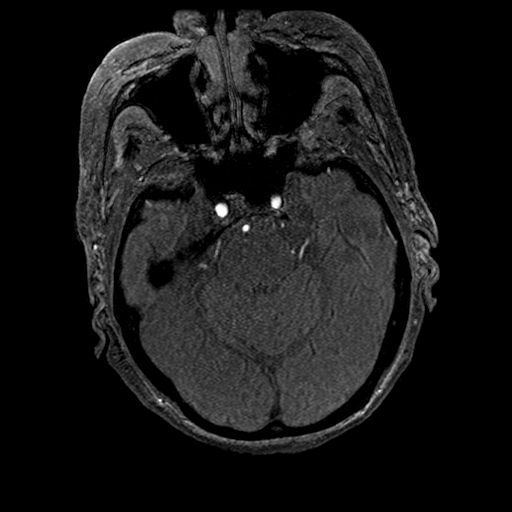
[im 101/152]
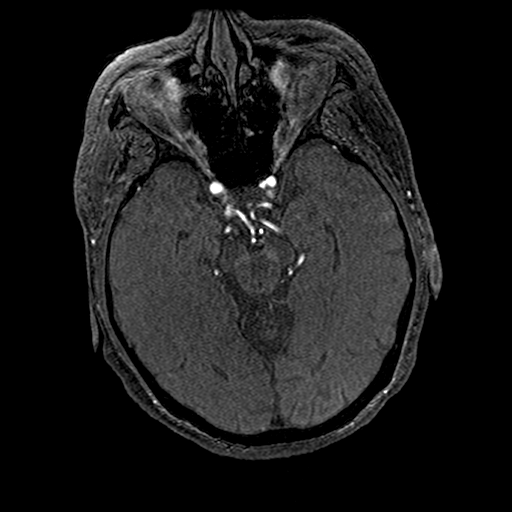

[Series 6: T2 · axial · 5.0mm · 0.43mm/px · z∈[-94,+62]mm · 2 of 27 slices shown (1 of 2)]
[im 1/27]
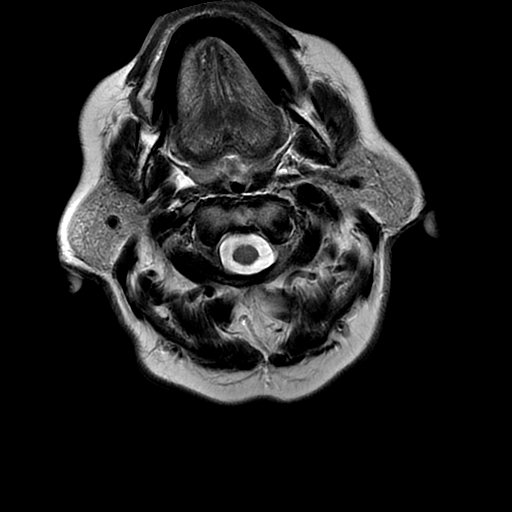
[im 27/27]
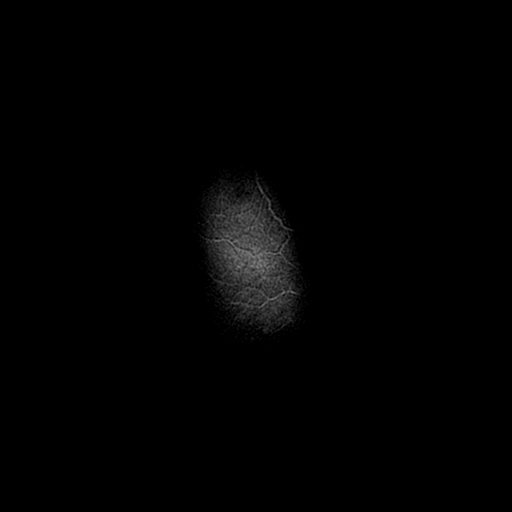

[Series 7: FLAIR · axial · 5.0mm · 0.43mm/px · z∈[-94,+62]mm · 2 of 27 slices shown (2 of 2)]
[im 1/27]
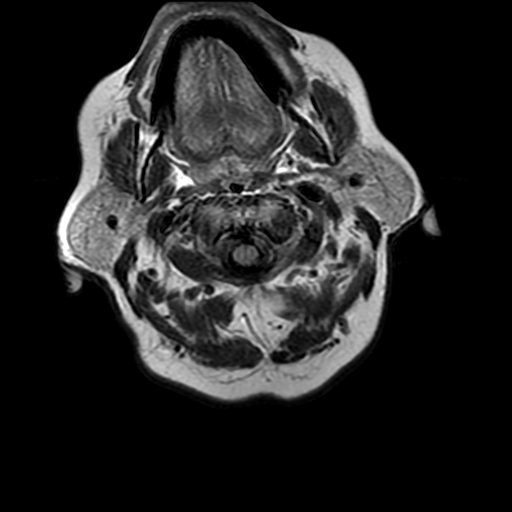
[im 27/27]
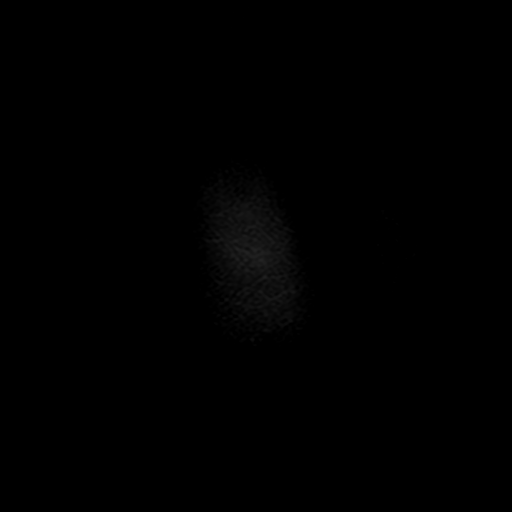

[Series 8: DWI · coronal · 5.0mm · 1.02mm/px · 5 of 70 slices shown (2 of 4)]
[im 1/70]
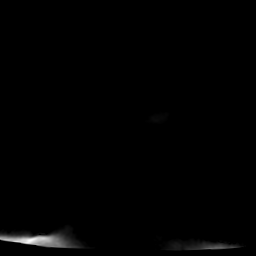
[im 18/70]
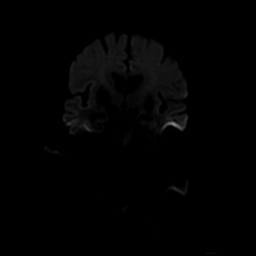
[im 35/70]
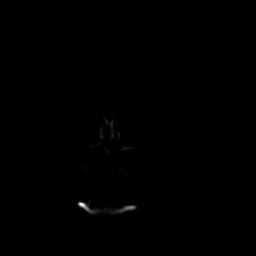
[im 52/70]
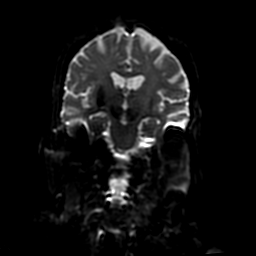
[im 70/70]
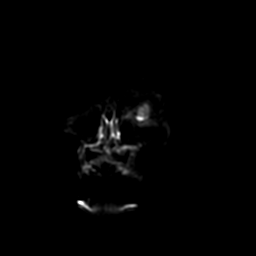

[Series 11: T2 · coronal · 5.0mm · 0.47mm/px · 2 of 30 slices shown (2 of 2)]
[im 1/30]
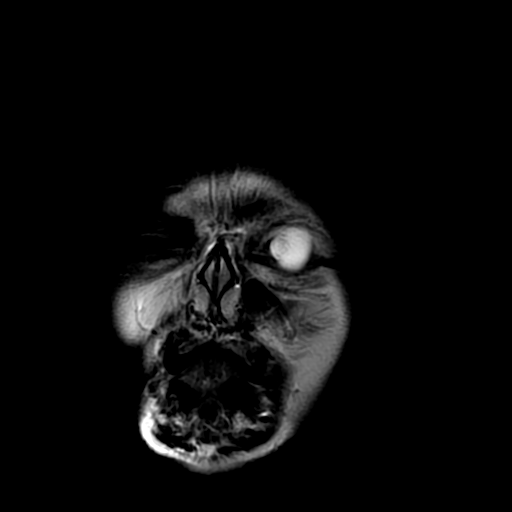
[im 30/30]
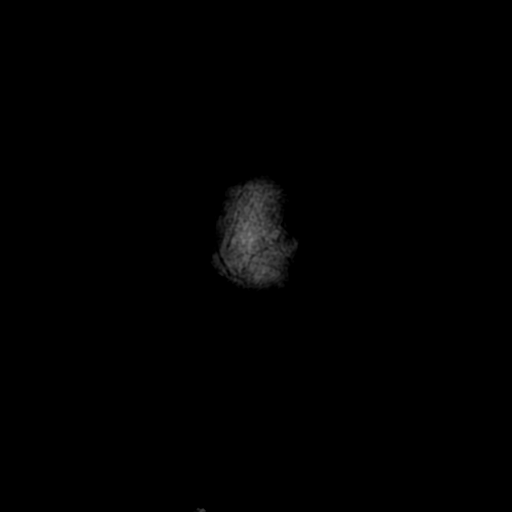

[Series 400: DWI · axial · 5.0mm · 1.02mm/px · z∈[-89,+60]mm · 2 of 31 slices shown (3 of 4)]
[im 1/31]
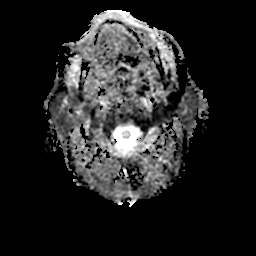
[im 31/31]
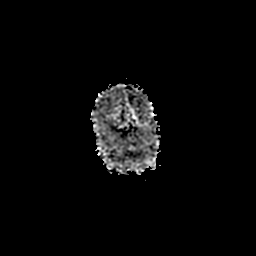

[Series 800: DWI · coronal · 5.0mm · 1.02mm/px · 2 of 35 slices shown (4 of 4)]
[im 1/35]
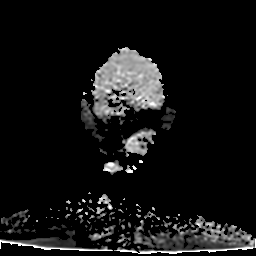
[im 35/35]
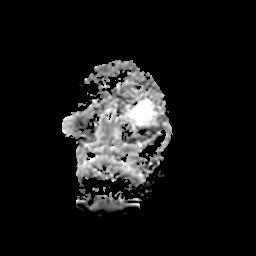

[27 of 48 positions shown; findings below may reference images not displayed]

FINDINGS: MRI HEAD FINDINGS

Widespread areas of restricted diffusion are seen throughout the
brain consistent with multiple areas of acute infarction. These
include the LEFT paramedian pons, RIGHT occipital lobe, RIGHT
posterior temporal lobe, LEFT posterior frontal cortex, and LEFT
posterior temporal lobe. There are no areas of hemorrhage, mass
lesion, hydrocephalus, or extra-axial fluid. Mild cerebral and
cerebellar atrophy is present. Mild subcortical and periventricular
T2 and FLAIR hyperintensities, likely chronic microvascular ischemic
change. Normal pituitary and cerebellar tonsils. Mild pannus. Mild
cervical spondylosis. Flow voids are maintained. BILATERAL cataract
extraction. No sinus or mastoid disease. Compared with prior CT, the
infarcts are not visible.

MRA HEAD FINDINGS

The internal carotid arteries are widely patent. The basilar artery
is widely patent with the LEFT vertebral the dominant/sole
contributor. RIGHT vertebral contributes to PICA. There is no
visible intracranial stenosis or berry aneurysm. No distal MCA and
PCA irregularity. 2 mm medial wide necked outpouching from the
superior cavernous LEFT ICA consistent with atheromatous disease.
IMPRESSION: Widespread areas of restricted diffusion are seen throughout the
brain, most notable in the LEFT paramedian pons, consistent with
multiple areas of acute infarction. No areas of hemorrhagic
transformation. These lie within different vascular territories. A
shower of emboli is not excluded. See discussion above.

No proximal flow reducing lesion is evident on intracranial MRA.
